# Patient Record
Sex: Female | Born: 1995
Health system: Southern US, Community
[De-identification: ages and names within clinical notes are randomized; demographics above are authoritative.]

## PROBLEM LIST (undated history)

## (undated) DIAGNOSIS — J189 Pneumonia, unspecified organism: Secondary | ICD-10-CM

## (undated) DIAGNOSIS — R0600 Dyspnea, unspecified: Secondary | ICD-10-CM

## (undated) HISTORY — PX: JOINT REPLACEMENT: SHX530

## (undated) HISTORY — PX: LUNG LOBECTOMY: SHX167

---
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
## 2017-05-11 DIAGNOSIS — R0602 Shortness of breath: Secondary | ICD-10-CM | POA: Diagnosis not present

## 2017-05-11 DIAGNOSIS — R9389 Abnormal findings on diagnostic imaging of other specified body structures: Secondary | ICD-10-CM | POA: Diagnosis not present

## 2017-05-11 DIAGNOSIS — J189 Pneumonia, unspecified organism: Secondary | ICD-10-CM | POA: Diagnosis not present

## 2017-05-11 DIAGNOSIS — R079 Chest pain, unspecified: Secondary | ICD-10-CM | POA: Diagnosis not present

## 2017-05-11 DIAGNOSIS — R918 Other nonspecific abnormal finding of lung field: Secondary | ICD-10-CM | POA: Diagnosis not present

## 2017-05-11 DIAGNOSIS — R0789 Other chest pain: Secondary | ICD-10-CM | POA: Diagnosis not present

## 2017-05-17 DIAGNOSIS — J189 Pneumonia, unspecified organism: Secondary | ICD-10-CM | POA: Diagnosis not present

## 2017-05-17 DIAGNOSIS — R9389 Abnormal findings on diagnostic imaging of other specified body structures: Secondary | ICD-10-CM | POA: Diagnosis not present

## 2017-07-01 DIAGNOSIS — J069 Acute upper respiratory infection, unspecified: Secondary | ICD-10-CM | POA: Diagnosis not present

## 2017-07-01 DIAGNOSIS — Z3009 Encounter for other general counseling and advice on contraception: Secondary | ICD-10-CM | POA: Diagnosis not present

## 2017-07-01 DIAGNOSIS — J029 Acute pharyngitis, unspecified: Secondary | ICD-10-CM | POA: Diagnosis not present

## 2017-07-01 DIAGNOSIS — J302 Other seasonal allergic rhinitis: Secondary | ICD-10-CM | POA: Diagnosis not present

## 2017-07-08 DIAGNOSIS — J029 Acute pharyngitis, unspecified: Secondary | ICD-10-CM | POA: Diagnosis not present

## 2017-07-08 DIAGNOSIS — J069 Acute upper respiratory infection, unspecified: Secondary | ICD-10-CM | POA: Diagnosis not present

## 2017-12-23 DIAGNOSIS — Z1389 Encounter for screening for other disorder: Secondary | ICD-10-CM | POA: Diagnosis not present

## 2017-12-23 DIAGNOSIS — Z68.41 Body mass index (BMI) pediatric, less than 5th percentile for age: Secondary | ICD-10-CM | POA: Diagnosis not present

## 2017-12-23 DIAGNOSIS — Z309 Encounter for contraceptive management, unspecified: Secondary | ICD-10-CM | POA: Diagnosis not present

## 2017-12-27 DIAGNOSIS — Z309 Encounter for contraceptive management, unspecified: Secondary | ICD-10-CM | POA: Diagnosis not present

## 2017-12-28 DIAGNOSIS — Z113 Encounter for screening for infections with a predominantly sexual mode of transmission: Secondary | ICD-10-CM | POA: Diagnosis not present

## 2018-01-15 DIAGNOSIS — Z113 Encounter for screening for infections with a predominantly sexual mode of transmission: Secondary | ICD-10-CM | POA: Diagnosis not present

## 2018-02-17 DIAGNOSIS — Z113 Encounter for screening for infections with a predominantly sexual mode of transmission: Secondary | ICD-10-CM | POA: Diagnosis not present

## 2018-04-29 DIAGNOSIS — J0300 Acute streptococcal tonsillitis, unspecified: Secondary | ICD-10-CM | POA: Diagnosis not present

## 2018-05-10 DIAGNOSIS — Z308 Encounter for other contraceptive management: Secondary | ICD-10-CM | POA: Diagnosis not present

## 2018-05-10 DIAGNOSIS — Z68.41 Body mass index (BMI) pediatric, less than 5th percentile for age: Secondary | ICD-10-CM | POA: Diagnosis not present

## 2018-05-10 DIAGNOSIS — Z1389 Encounter for screening for other disorder: Secondary | ICD-10-CM | POA: Diagnosis not present

## 2018-05-16 DIAGNOSIS — J00 Acute nasopharyngitis [common cold]: Secondary | ICD-10-CM | POA: Diagnosis not present

## 2018-05-19 ENCOUNTER — Encounter: Payer: Self-pay | Admitting: Women's Health

## 2018-06-01 ENCOUNTER — Ambulatory Visit: Payer: Self-pay | Admitting: *Deleted

## 2018-06-01 DIAGNOSIS — J020 Streptococcal pharyngitis: Secondary | ICD-10-CM | POA: Diagnosis not present

## 2018-06-01 NOTE — Telephone Encounter (Signed)
Pt called with having a severe throat and low grade fever of 100. She is hoarse and can see white patches in the back of her throat. She is not able to eat or drink. She does not have a provider in the area. Recommended that she goes to an urgent care. Recommend that she make an appointment with her provider if she does not start feeling better after going to an UC. Home care advice given with verbal understanding. Advised to gargle with warm salt water, sucking on hard candy or throat lozenges, eat cold things like popsicles.  Pt voiced understanding.  Reason for Disposition . [1] Pus on tonsils (back of throat) AND [2]  fever AND [3] swollen neck lymph nodes ("glands")  Answer Assessment - Initial Assessment Questions 1. ONSET: "When did the throat start hurting?" (Hours or days ago)      Sunday 2. SEVERITY: "How bad is the sore throat?" (Scale 1-10; mild, moderate or severe)   - MILD (1-3):  doesn\'t interfere with eating or normal activities   - MODERATE (4-7): interferes with eating some solids and normal activities   - SEVERE (8-10):  excruciating pain, interferes with most normal activities   - SEVERE DYSPHAGIA: can\'t swallow liquids, drooling     Severe, can not swallow  3. STREP EXPOSURE: "Has there been any exposure to strep within the past week?" If so, ask: "What type of contact occurred?"      No, had strep throat in February 4.  VIRAL SYMPTOMS: "Are there any symptoms of a cold, such as a runny nose, cough, hoarse voice or red eyes?"      hoarse 5. FEVER: "Do you have a fever?" If so, ask: "What is your temperature, how was it measured, and when did it start?"     10 0 today 6. PUS ON THE TONSILS: "Is there pus on the tonsils in the back of your throat?"     White patches 7. OTHER SYMPTOMS: "Do you have any other symptoms?" (e.g., difficulty breathing, headache, rash)     Short of breath when walking up the stairs, migraine (does not have hx) 8. PREGNANCY: "Is there any chance  you are pregnant?" "When was your last menstrual period?"     No LMP using Depop[facility]ary for Birth Control  Protocols used: SORE THROAT-A-AH

## 2018-06-02 ENCOUNTER — Ambulatory Visit
Admission: EM | Admit: 2018-06-02 | Discharge: 2018-06-02 | Disposition: A | Discharge status: Home / Self Care | Payer: BLUE CROSS/BLUE SHIELD | Attending: Family Medicine | Admitting: Family Medicine

## 2018-06-02 ENCOUNTER — Encounter: Payer: Self-pay | Admitting: Emergency Medicine

## 2018-06-02 DIAGNOSIS — N898 Other specified noninflammatory disorders of vagina: Secondary | ICD-10-CM

## 2018-06-02 DIAGNOSIS — Z3202 Encounter for pregnancy test, result negative: Secondary | ICD-10-CM | POA: Diagnosis not present

## 2018-06-02 LAB — POCT URINALYSIS DIP (MANUAL ENTRY)
BILIRUBIN UA: NEGATIVE
BILIRUBIN UA: NEGATIVE mg/dL
GLUCOSE UA: NEGATIVE mg/dL
Nitrite, UA: NEGATIVE
Urobilinogen, UA: 4 E.U./dL — AB
pH, UA: 7 (ref 5.0–8.0)

## 2018-06-02 LAB — POCT URINE PREGNANCY: Preg Test, Ur: NEGATIVE

## 2018-06-02 MED ORDER — FLUCONAZOLE 200 MG PO TABS: ORAL_TABLET | ORAL | 0 refills | Status: DC

## 2018-06-02 NOTE — ED Triage Notes (Signed)
Pt presents to [facility] for assessment of vaginal pain and burning with urination since Sunday.  Did not improve with ibuprofen.

## 2018-06-02 NOTE — Discharge Instructions (Signed)
Vaginal self-swab obtained Prescribed diflucan 200 mg once daily and then second dose after you complete amoxicillin.   We will follow up with you regarding the results of your test If tests are positive, please abstain from sexual activity for at least 7 days and notify partners Follow up with PCP if symptoms persists Return here or go to ER if you have any new or worsening symptoms fever, chills, nausea, vomiting, abdominal or pelvic pain, painful intercourse, vaginal discharge, vaginal bleeding, persistent symptoms despite treatment, etc..Marland Kitchen

## 2018-06-02 NOTE — ED Provider Notes (Addendum)
[facility]   [id number] 06/02/18 Arrival Time: 1939   [id number] irritation  SUBJECTIVE:  Pam Clark is a 23 y.o. female who presents with complaint of gradual vaginal irritation that began 3 days ago.  Recently treated for strep throat with amoxicillin and steroid shot.  Last sex last week.   Complains of "normal" vaginal discharge, but denies thick or white discharge.  She has tried OTC medications ibuprofen without relief.  She reports worsening symptoms with urination, but denies dysuria. Denies similar symptoms in the past.  She denies fever, chills, nausea, vomiting, abdominal or pelvic pain, urinary symptoms, vaginal itching, vaginal odor, vaginal bleeding, vaginal rashes or lesions.   No LMP recorded. Patient has had an injection.  ROS: As per HPI.  History reviewed. No pertinent past medical history. History reviewed. No pertinent surgical history. No Known Allergies No current facility-administered medications on file prior to encounter.    No current outpatient medications on file prior to encounter.    Social History   Socioeconomic History  . Marital status: Single    Spouse name: Not on file  . Number of children: Not on file  . Years of education: Not on file  . Highest education level: Not on file  Occupational History  . Not on file  Social Needs  . Financial resource strain: Not on file  . Food insecurity:    Worry: Not on file    Inability: Not on file  . Transportation needs:    Medical: Not on file    Non-medical: Not on file  Tobacco Use  . Smoking status: Never Smoker  . Smokeless tobacco: Never Used  Substance and Sexual Activity  . Alcohol use: No  . Drug use: No  . Sexual activity: Yes    Birth control/protection: Pill  Lifestyle  . Physical activity:    Days per week: Not on file    Minutes per session: Not on file  . Stress: Not on file  Relationships  . Social connections:    Talks on phone: Not on file    Gets  together: Not on file    Attends religious service: Not on file    Active member of club or organization: Not on file    Attends meetings of clubs or organizations: Not on file    Relationship status: Not on file  . Intimate partner violence:    Fear of current or ex partner: Not on file    Emotionally abused: Not on file    Physically abused: Not on file    Forced sexual activity: Not on file  Other Topics Concern  . Not on file  Social History Narrative  . Not on file   Family History  Problem Relation Age of Onset  . Stroke Other   . Diabetes Other   . Hypertension Other   . Seizures Other     OBJECTIVE:  Vitals:   06/02/18 1955  BP: 109/71  Pulse: 93  Resp: 18  Temp: 97.9 F (36.6 C)  TempSrc: Oral  SpO2: 95%     General appearance: Alert, NAD, appears stated age Head: NCAT Throat: lips, mucosa, and tongue normal; teeth and gums normal Lungs: CTA bilaterally without adventitious breath sounds Heart: regular rate and rhythm.  Radial pulses 2+ symmetrical bilaterally Back: no CVA tenderness Abdomen: soft, non-tender; bowel sounds normal; no guarding GU: deferred; vaginal swab obtained Skin: warm and dry Psychological:  Alert and cooperative. Normal mood and affect.  LABS:  Results  for orders placed or performed during the hospital encounter of 06/02/18  POCT urinalysis dipstick  Result Value Ref Range   Color, UA yellow yellow   Clarity, UA clear clear   Glucose, UA negative negative mg/dL   Bilirubin, UA negative negative   Ketones, POC UA negative negative mg/dL   Spec Grav, UA >=7.425 (A) 1.010 - 1.025   Blood, UA trace-intact (A) negative   pH, UA 7.0 5.0 - 8.0   Protein Ur, POC =100 (A) negative mg/dL   Urobilinogen, UA 4.0 (A) 0.2 or 1.0 E.U./dL   Nitrite, UA Negative Negative   Leukocytes, UA Trace (A) Negative  POCT urine pregnancy  Result Value Ref Range   Preg Test, Ur Negative Negative    Labs Reviewed  POCT URINALYSIS DIP (MANUAL  ENTRY) - Abnormal; Notable for the following components:      Result Value   Spec Grav, UA >=1.030 (*)    Blood, UA trace-intact (*)    Protein Ur, POC =100 (*)    Urobilinogen, UA 4.0 (*)    Leukocytes, UA Trace (*)    All other components within normal limits  POCT URINE PREGNANCY - Normal  URINE CULTURE  CERVICOVAGINAL ANCILLARY ONLY   ASSESSMENT & PLAN:  1. Vaginal irritation     Meds ordered this encounter  Medications  . fluconazole (DIFLUCAN) 200 MG tablet    Sig: Take one dose by mouth, wait until you finish your antibiotic, and then take second dose by mouth    Dispense:  2 tablet    Refill:  0    Order Specific Question:   Supervising Provider    Answer:   Eustace Moore [[id number]]    Pending: Labs Reviewed  POCT URINALYSIS DIP (MANUAL ENTRY) - Abnormal; Notable for the following components:      Result Value   Spec Grav, UA >=1.030 (*)    Blood, UA trace-intact (*)    Protein Ur, POC =100 (*)    Urobilinogen, UA 4.0 (*)    Leukocytes, UA Trace (*)    All other components within normal limits  POCT URINE PREGNANCY - Normal  URINE CULTURE  CERVICOVAGINAL ANCILLARY ONLY    Vaginal self-swab obtained Prescribed diflucan 200 mg once daily and then second dose after you complete amoxicillin.   We will follow up with you regarding the results of your test If tests are positive, please abstain from sexual activity for at least 7 days and notify partners Follow up with PCP if symptoms persists Return here or go to ER if you have any new or worsening symptoms fever, chills, nausea, vomiting, abdominal or pelvic pain, painful intercourse, vaginal discharge, vaginal bleeding, persistent symptoms despite treatment, etc...  Reviewed expectations re: course of current medical issues. Questions answered. Outlined signs and symptoms indicating need for more acute intervention. Patient verbalized understanding. After Visit Summary given.       Rennis Harding,  PA-C 06/02/18 2048    Rennis Harding, PA-C 06/02/18 2049

## 2018-06-02 NOTE — ED Notes (Signed)
Patient able to ambulate independently  

## 2018-06-03 DIAGNOSIS — B009 Herpesviral infection, unspecified: Secondary | ICD-10-CM | POA: Diagnosis not present

## 2018-06-04 LAB — URINE CULTURE: Culture: NO GROWTH

## 2018-06-06 LAB — CERVICOVAGINAL ANCILLARY ONLY
Bacterial vaginitis: POSITIVE — AB
CANDIDA VAGINITIS: NEGATIVE
CHLAMYDIA, DNA PROBE: NEGATIVE
NEISSERIA GONORRHEA: NEGATIVE
TRICH (WINDOWPATH): NEGATIVE

## 2018-06-07 ENCOUNTER — Telehealth (HOSPITAL_COMMUNITY): Payer: Self-pay | Admitting: Emergency Medicine

## 2018-06-07 MED ORDER — METRONIDAZOLE 500 MG PO TABS: 500.0000 mg | ORAL_TABLET | Freq: Two times a day (BID) | ORAL | 0 refills | Status: AC

## 2018-06-07 NOTE — Telephone Encounter (Signed)
Bacterial vaginosis is positive. This was not treated at the urgent care visit.  Flagyl 500 mg BID x 7 days #14 no refills sent to patients pharmacy of choice.    Patient contacted and made aware of all results, all questions answered.  

## 2018-06-27 DIAGNOSIS — J309 Allergic rhinitis, unspecified: Secondary | ICD-10-CM | POA: Diagnosis not present

## 2018-06-27 DIAGNOSIS — Z1389 Encounter for screening for other disorder: Secondary | ICD-10-CM | POA: Diagnosis not present

## 2018-06-27 DIAGNOSIS — Z68.41 Body mass index (BMI) pediatric, less than 5th percentile for age: Secondary | ICD-10-CM | POA: Diagnosis not present

## 2018-06-27 DIAGNOSIS — J302 Other seasonal allergic rhinitis: Secondary | ICD-10-CM | POA: Diagnosis not present

## 2018-08-15 ENCOUNTER — Encounter: Payer: Self-pay | Admitting: *Deleted

## 2018-08-16 ENCOUNTER — Encounter: Payer: Self-pay | Admitting: Women's Health

## 2018-08-16 ENCOUNTER — Other Ambulatory Visit: Payer: Self-pay

## 2018-08-16 ENCOUNTER — Ambulatory Visit (INDEPENDENT_AMBULATORY_CARE_PROVIDER_SITE_OTHER): Payer: BLUE CROSS/BLUE SHIELD | Admitting: Women's Health

## 2018-08-16 VITALS — Ht 66.0 in | Wt 140.0 lb

## 2018-08-16 DIAGNOSIS — Z3009 Encounter for other general counseling and advice on contraception: Secondary | ICD-10-CM

## 2018-08-16 NOTE — Progress Notes (Signed)
   TELEHEALTH VIRTUAL GYN VISIT ENCOUNTER NOTE Patient name: Pam Clark MRN [id number]  Date of birth: Dec 06, 1995  I connected with patient on 08/16/18 at  8:30 AM EDT by [facility] and verified that I am speaking with the correct person using two identifiers.  Due to COVID-19 recommendations, pt is not currently in the office.    I discussed the limitations, risks, security and privacy concerns of performing an evaluation and management service by telephone and the availability of in person appointments. I also discussed with the patient that there may be a patient responsible charge related to this service. The patient expressed understanding and agreed to proceed.   Chief Complaint:   discuss birth control  History of Present Illness:   Pam Clark is a 23 y.o.  African American female being evaluated today to discuss birth control. Wants Nexplanon. Last sex 5/14.   Patient's last menstrual period was 08/02/2018. The current method of family planning is condoms  Last pap <21yo. Results were:  n/a Review of Systems:   Pertinent items are noted in HPI Denies fever/chills, dizziness, headaches, visual disturbances, fatigue, shortness of breath, chest pain, abdominal pain, vomiting, abnormal vaginal discharge/itching/odor/irritation, problems with periods, bowel movements, urination, or intercourse unless otherwise stated above.  Pertinent History Reviewed:  Reviewed past medical,surgical, social, obstetrical and family history.  Reviewed problem list, medications and allergies. Physical Assessment:   Vitals:   08/16/18 0924  Weight: 140 lb (63.5 kg)  Height: 5\' 6"  (1.676 m)  Body mass index is 22.6 kg/m.       Physical Examination:   General:  Alert, oriented and cooperative.   Mental Status: Normal mood and affect perceived. Normal judgment and thought content.  Physical exam deferred due to nature of the encounter  No results found for this or any previous visit (from the past 24  hour(s)).  Assessment & Plan:  1) Contraception counseling> abstinence until nexplanon insertion, schedule for 5/28 (14d from last sex)  Meds: No orders of the defined types were placed in this encounter.   No orders of the defined types were placed in this encounter.   I discussed the assessment and treatment plan with the patient. The patient was provided an opportunity to ask questions and all were answered. The patient agreed with the plan and demonstrated an understanding of the instructions.   The patient was advised to call back or seek an in-person evaluation/go to the ED if the symptoms worsen or if the condition fails to improve as anticipated.  I provided 15 minutes of non-face-to-face time during this encounter.   Return in about 9 days (around 08/25/2018) for Nexplanon insertion.  Cheral Marker CNM, [facility] 08/16/2018 9:49 AM

## 2018-08-24 ENCOUNTER — Encounter: Payer: Self-pay | Admitting: *Deleted

## 2018-08-25 ENCOUNTER — Encounter: Payer: Self-pay | Admitting: Women's Health

## 2018-08-25 ENCOUNTER — Other Ambulatory Visit: Payer: Self-pay

## 2018-08-25 ENCOUNTER — Ambulatory Visit (INDEPENDENT_AMBULATORY_CARE_PROVIDER_SITE_OTHER): Payer: BLUE CROSS/BLUE SHIELD | Admitting: Women's Health

## 2018-08-25 VITALS — BP 89/57 | HR 69 | Temp 98.4°F | Ht 66.0 in | Wt 142.0 lb

## 2018-08-25 DIAGNOSIS — Z30017 Encounter for initial prescription of implantable subdermal contraceptive: Secondary | ICD-10-CM | POA: Insufficient documentation

## 2018-08-25 DIAGNOSIS — Z3202 Encounter for pregnancy test, result negative: Secondary | ICD-10-CM | POA: Diagnosis not present

## 2018-08-25 LAB — POCT URINE PREGNANCY: Preg Test, Ur: NEGATIVE

## 2018-08-25 MED ORDER — ETONOGESTREL 68 MG ~~LOC~~ IMPL
68.0000 mg | DRUG_IMPLANT | Freq: Once | SUBCUTANEOUS | Status: AC
  Administered 2018-08-25: 68 mg via SUBCUTANEOUS

## 2018-08-25 NOTE — Patient Instructions (Signed)
Keep the area clean and dry.  You can remove the big bandage in 24 hours, and the small steri-strip bandage in 3-5 days.  A back up method, such as condoms, should be used for two weeks. You may have irregular vaginal bleeding for the first 6 months after the Nexplanon is placed, then the bleeding usually lightens and it is possible that you may not have any periods.  If you have any concerns, please give us a call.    Etonogestrel implant What is this medicine? ETONOGESTREL (et oh noe JES trel) is a contraceptive (birth control) device. It is used to prevent pregnancy. It can be used for up to 3 years. This medicine may be used for other purposes; ask your health care provider or pharmacist if you have questions. COMMON BRAND NAME(S): Implanon, Nexplanon What should I tell my health care provider before I take this medicine? They need to know if you have any of these conditions: -abnormal vaginal bleeding -blood vessel disease or blood clots -breast, cervical, endometrial, ovarian, liver, or uterine cancer -diabetes -gallbladder disease -heart disease or recent heart attack -high blood pressure -high cholesterol or triglycerides -kidney disease -liver disease -migraine headaches -seizures -stroke -tobacco smoker -an unusual or allergic reaction to etonogestrel, anesthetics or antiseptics, other medicines, foods, dyes, or preservatives -pregnant or trying to get pregnant -breast-feeding How should I use this medicine? This device is inserted just under the skin on the inner side of your upper arm by a health care professional. Talk to your pediatrician regarding the use of this medicine in children. Special care may be needed. Overdosage: If you think you have taken too much of this medicine contact a poison control center or emergency room at once. NOTE: This medicine is only for you. Do not share this medicine with others. What if I miss a dose? This does not apply. What may  interact with this medicine? Do not take this medicine with any of the following medications: -amprenavir -fosamprenavir This medicine may also interact with the following medications: -acitretin -aprepitant -armodafinil -bexarotene -bosentan -carbamazepine -certain medicines for fungal infections like fluconazole, ketoconazole, itraconazole and voriconazole -certain medicines to treat hepatitis, HIV or AIDS -cyclosporine -felbamate -griseofulvin -lamotrigine -modafinil -oxcarbazepine -phenobarbital -phenytoin -primidone -rifabutin -rifampin -rifapentine -St. John's wort -topiramate This list may not describe all possible interactions. Give your health care provider a list of all the medicines, herbs, non-prescription drugs, or dietary supplements you use. Also tell them if you smoke, drink alcohol, or use illegal drugs. Some items may interact with your medicine. What should I watch for while using this medicine? This product does not protect you against HIV infection (AIDS) or other sexually transmitted diseases. You should be able to feel the implant by pressing your fingertips over the skin where it was inserted. Contact your doctor if you cannot feel the implant, and use a non-hormonal birth control method (such as condoms) until your doctor confirms that the implant is in place. Contact your doctor if you think that the implant may have broken or become bent while in your arm. You will receive a user card from your health care provider after the implant is inserted. The card is a record of the location of the implant in your upper arm and when it should be removed. Keep this card with your health records. What side effects may I notice from receiving this medicine? Side effects that you should report to your doctor or health care professional as soon as possible: -allergic   reactions like skin rash, itching or hives, swelling of the face, lips, or tongue -breast lumps, breast  tissue changes, or discharge -breathing problems -changes in emotions or moods -if you feel that the implant may have broken or bent while in your arm -high blood pressure -pain, irritation, swelling, or bruising at the insertion site -scar at site of insertion -signs of infection at the insertion site such as fever, and skin redness, pain or discharge -signs and symptoms of a blood clot such as breathing problems; changes in vision; chest pain; severe, sudden headache; pain, swelling, warmth in the leg; trouble speaking; sudden numbness or weakness of the face, arm or leg -signs and symptoms of liver injury like dark yellow or brown urine; general ill feeling or flu-like symptoms; light-colored stools; loss of appetite; nausea; right upper belly pain; unusually weak or tired; yellowing of the eyes or skin -unusual vaginal bleeding, discharge Side effects that usually do not require medical attention (report to your doctor or health care professional if they continue or are bothersome): -acne -breast pain or tenderness -headache -irregular menstrual bleeding -nausea This list may not describe all possible side effects. Call your doctor for medical advice about side effects. You may report side effects to FDA at 1-800-FDA-1088. Where should I keep my medicine? This drug is given in a hospital or clinic and will not be stored at home. NOTE: This sheet is a summary. It may not cover all possible information. If you have questions about this medicine, talk to your doctor, pharmacist, or health care provider.  2019 Elsevier/Gold Standard (2017-02-02 14:11:42)  

## 2018-08-25 NOTE — Progress Notes (Signed)
   NEXPLANON INSERTION Patient name: Pam Clark MRN [id number]  Date of birth: 1996/02/19 Subjective Findings:   Pam Clark is a 23 y.o. G0 African American female being seen today for insertion of a Nexplanon.   Patient's last menstrual period was 08/25/2018. Last sexual intercourse was 5/14 Last pap<23yo. Results were:  n/a  Risks/benefits/side effects of Nexplanon have been discussed and her questions have been answered.  Specifically, a failure rate of 03/998 has been reported, with an increased failure rate if pt takes St. John's Wort and/or antiseizure medicaitons.  She is aware of the common side effect of irregular bleeding, which the incidence of decreases over time. Signed copy of informed consent in chart.  Pertinent History Reviewed:   Reviewed past medical,surgical, social, obstetrical and family history.  Reviewed problem list, medications and allergies. Objective Findings & Procedure:    Vitals:   08/25/18 0838  BP: (!) 89/57  Pulse: 69  Temp: 98.4 F (36.9 C)  Weight: 142 lb (64.4 kg)  Height: 5\' 6"  (1.676 m)  Body mass index is 22.92 kg/m.  Results for orders placed or performed in visit on 08/25/18 (from the past 24 hour(s))  POCT urine pregnancy   Collection Time: 08/25/18  8:46 AM  Result Value Ref Range   Preg Test, Ur Negative Negative     Time out was performed.  She is right-handed, so her left arm, approximately 10cm from the medial epicondyle and 3-5cm posterior to the sulcus, was cleansed with alcohol and anesthetized with 2cc of 2% Lidocaine.  The area was cleansed again with betadine and the Nexplanon was inserted per manufacturer's recommendations without difficulty.  3 steri-strips and pressure bandage were applied. The patient tolerated the procedure well.  Assessment & Plan:   1) Nexplanon insertion Pt was instructed to keep the area clean and dry, remove pressure bandage in 24 hours, and keep insertion site covered with the steri-strip for  3-5 days.  Back up contraception was recommended for 2 weeks.  She was given a card indicating date Nexplanon was inserted and date it needs to be removed. Follow-up PRN problems.  Orders Placed This Encounter  Procedures  . POCT urine pregnancy    Follow-up: Return for 23yo for pap & physical.  Cheral Marker CNM, [facility] 08/25/2018 9:17 AM

## 2018-08-25 NOTE — Addendum Note (Signed)
Addended by: Federico Flake A on: 08/25/2018 09:41 AM   Modules accepted: Orders

## 2018-10-25 DIAGNOSIS — Z113 Encounter for screening for infections with a predominantly sexual mode of transmission: Secondary | ICD-10-CM | POA: Diagnosis not present

## 2018-11-02 DIAGNOSIS — L258 Unspecified contact dermatitis due to other agents: Secondary | ICD-10-CM | POA: Diagnosis not present

## 2018-11-02 DIAGNOSIS — L878 Other transepidermal elimination disorders: Secondary | ICD-10-CM | POA: Diagnosis not present

## 2019-02-01 DIAGNOSIS — N760 Acute vaginitis: Secondary | ICD-10-CM | POA: Diagnosis not present

## 2019-02-01 DIAGNOSIS — Z113 Encounter for screening for infections with a predominantly sexual mode of transmission: Secondary | ICD-10-CM | POA: Diagnosis not present

## 2019-02-02 DIAGNOSIS — Z113 Encounter for screening for infections with a predominantly sexual mode of transmission: Secondary | ICD-10-CM | POA: Diagnosis not present

## 2019-02-02 DIAGNOSIS — N760 Acute vaginitis: Secondary | ICD-10-CM | POA: Diagnosis not present

## 2019-02-02 DIAGNOSIS — Z20828 Contact with and (suspected) exposure to other viral communicable diseases: Secondary | ICD-10-CM | POA: Diagnosis not present

## 2019-04-16 ENCOUNTER — Emergency Department (HOSPITAL_COMMUNITY): Payer: BC Managed Care – PPO

## 2019-04-16 ENCOUNTER — Encounter (HOSPITAL_COMMUNITY): Payer: Self-pay | Admitting: Radiology

## 2019-04-16 ENCOUNTER — Emergency Department (HOSPITAL_COMMUNITY)
Admission: EM | Admit: 2019-04-16 | Discharge: 2019-04-16 | Disposition: A | Discharge status: Home / Self Care | Payer: BC Managed Care – PPO | Attending: Emergency Medicine | Admitting: Emergency Medicine

## 2019-04-16 DIAGNOSIS — Y92410 Unspecified street and highway as the place of occurrence of the external cause: Secondary | ICD-10-CM | POA: Insufficient documentation

## 2019-04-16 DIAGNOSIS — Y999 Unspecified external cause status: Secondary | ICD-10-CM | POA: Diagnosis not present

## 2019-04-16 DIAGNOSIS — S73005A Unspecified dislocation of left hip, initial encounter: Secondary | ICD-10-CM | POA: Diagnosis not present

## 2019-04-16 DIAGNOSIS — Z03818 Encounter for observation for suspected exposure to other biological agents ruled out: Secondary | ICD-10-CM | POA: Diagnosis not present

## 2019-04-16 DIAGNOSIS — Y9389 Activity, other specified: Secondary | ICD-10-CM | POA: Insufficient documentation

## 2019-04-16 DIAGNOSIS — S27321A Contusion of lung, unilateral, initial encounter: Secondary | ICD-10-CM | POA: Insufficient documentation

## 2019-04-16 DIAGNOSIS — S199XXA Unspecified injury of neck, initial encounter: Secondary | ICD-10-CM | POA: Diagnosis not present

## 2019-04-16 DIAGNOSIS — M25552 Pain in left hip: Secondary | ICD-10-CM | POA: Diagnosis not present

## 2019-04-16 DIAGNOSIS — S3991XA Unspecified injury of abdomen, initial encounter: Secondary | ICD-10-CM | POA: Diagnosis not present

## 2019-04-16 DIAGNOSIS — S72052A Unspecified fracture of head of left femur, initial encounter for closed fracture: Secondary | ICD-10-CM | POA: Diagnosis not present

## 2019-04-16 DIAGNOSIS — Z20822 Contact with and (suspected) exposure to covid-19: Secondary | ICD-10-CM | POA: Insufficient documentation

## 2019-04-16 DIAGNOSIS — S8992XA Unspecified injury of left lower leg, initial encounter: Secondary | ICD-10-CM | POA: Diagnosis not present

## 2019-04-16 DIAGNOSIS — R52 Pain, unspecified: Secondary | ICD-10-CM | POA: Diagnosis not present

## 2019-04-16 DIAGNOSIS — S79912A Unspecified injury of left hip, initial encounter: Secondary | ICD-10-CM | POA: Diagnosis not present

## 2019-04-16 DIAGNOSIS — S0990XA Unspecified injury of head, initial encounter: Secondary | ICD-10-CM | POA: Diagnosis not present

## 2019-04-16 DIAGNOSIS — S299XXA Unspecified injury of thorax, initial encounter: Secondary | ICD-10-CM | POA: Diagnosis not present

## 2019-04-16 DIAGNOSIS — M25562 Pain in left knee: Secondary | ICD-10-CM | POA: Diagnosis not present

## 2019-04-16 LAB — URINALYSIS, ROUTINE W REFLEX MICROSCOPIC
Bacteria, UA: NONE SEEN
Bilirubin Urine: NEGATIVE
Glucose, UA: NEGATIVE mg/dL
Hgb urine dipstick: NEGATIVE
Ketones, ur: 5 mg/dL — AB
Nitrite: NEGATIVE
Protein, ur: 30 mg/dL — AB
Specific Gravity, Urine: 1.032 — ABNORMAL HIGH (ref 1.005–1.030)
pH: 6 (ref 5.0–8.0)

## 2019-04-16 LAB — I-STAT BETA HCG BLOOD, ED (MC, WL, AP ONLY): I-stat hCG, quantitative: <5 m[IU]/mL (ref <5)

## 2019-04-16 LAB — COMPREHENSIVE METABOLIC PANEL
ALT: 36 U/L (ref 0–44)
AST: 54 U/L — ABNORMAL HIGH (ref 15–41)
Albumin: 4.5 g/dL (ref 3.5–5.0)
Alkaline Phosphatase: 59 U/L (ref 38–126)
Anion gap: 10 (ref 5–15)
BUN: 17 mg/dL (ref 6–20)
CO2: 25 mmol/L (ref 22–32)
Calcium: 9.4 mg/dL (ref 8.9–10.3)
Chloride: 104 mmol/L (ref 98–111)
Creatinine, Ser: 0.82 mg/dL (ref 0.44–1.00)
GFR calc Af Amer: >60 mL/min (ref >60)
GFR calc non Af Amer: >60 mL/min (ref >60)
Glucose, Bld: 142 mg/dL — ABNORMAL HIGH (ref 70–99)
Potassium: 3.1 mmol/L — ABNORMAL LOW (ref 3.5–5.1)
Sodium: 139 mmol/L (ref 135–145)
Total Bilirubin: 0.7 mg/dL (ref 0.3–1.2)
Total Protein: 7.5 g/dL (ref 6.5–8.1)

## 2019-04-16 LAB — CBC
HCT: 36.4 % (ref 36.0–46.0)
Hemoglobin: 11.9 g/dL — ABNORMAL LOW (ref 12.0–15.0)
MCH: 31.2 pg (ref 26.0–34.0)
MCHC: 32.7 g/dL (ref 30.0–36.0)
MCV: 95.3 fL (ref 80.0–100.0)
Platelets: 216 10*3/uL (ref 150–400)
RBC: 3.82 MIL/uL — ABNORMAL LOW (ref 3.87–5.11)
RDW: 12.2 % (ref 11.5–15.5)
WBC: 7.2 10*3/uL (ref 4.0–10.5)
nRBC: 0 % (ref 0.0–0.2)

## 2019-04-16 LAB — I-STAT CHEM 8, ED
BUN: 18 mg/dL (ref 6–20)
Calcium, Ion: 1.13 mmol/L — ABNORMAL LOW (ref 1.15–1.40)
Chloride: 103 mmol/L (ref 98–111)
Creatinine, Ser: 0.7 mg/dL (ref 0.44–1.00)
Glucose, Bld: 136 mg/dL — ABNORMAL HIGH (ref 70–99)
HCT: 38 % (ref 36.0–46.0)
Hemoglobin: 12.9 g/dL (ref 12.0–15.0)
Potassium: 3.2 mmol/L — ABNORMAL LOW (ref 3.5–5.1)
Sodium: 139 mmol/L (ref 135–145)
TCO2: 25 mmol/L (ref 22–32)

## 2019-04-16 LAB — ETHANOL: Alcohol, Ethyl (B): <10 mg/dL (ref <10)

## 2019-04-16 LAB — RESPIRATORY PANEL BY RT PCR (FLU A&B, COVID)
Influenza A by PCR: NEGATIVE
Influenza B by PCR: NEGATIVE
SARS Coronavirus 2 by RT PCR: NEGATIVE

## 2019-04-16 MED ORDER — IOHEXOL 350 MG/ML SOLN
80.0000 mL | Freq: Once | INTRAVENOUS | Status: AC | PRN
  Administered 2019-04-16: 16:00:00 80 mL via INTRAVENOUS

## 2019-04-16 MED ORDER — MORPHINE SULFATE 10 MG/ML IJ SOLN
INTRAMUSCULAR | Status: AC | PRN
  Administered 2019-04-16: 25 mg via INTRAVENOUS

## 2019-04-16 MED ORDER — ONDANSETRON HCL 4 MG/2ML IJ SOLN
4.0000 mg | Freq: Once | INTRAMUSCULAR | Status: AC
  Administered 2019-04-16: 18:00:00 4 mg via INTRAVENOUS

## 2019-04-16 MED ORDER — SODIUM CHLORIDE 0.9 % IV BOLUS
1000.0000 mL | Freq: Once | INTRAVENOUS | Status: AC
  Administered 2019-04-16: 10:00:00 1000 mL via INTRAVENOUS

## 2019-04-16 MED ORDER — TRAMADOL HCL 50 MG PO TABS: 50.0000 mg | ORAL_TABLET | Freq: Four times a day (QID) | ORAL | 0 refills | Status: DC | PRN

## 2019-04-16 MED ORDER — FENTANYL CITRATE (PF) 100 MCG/2ML IJ SOLN
INTRAMUSCULAR | Status: AC
  Filled 2019-04-16: qty 2

## 2019-04-16 MED ORDER — MORPHINE SULFATE (PF) 4 MG/ML IV SOLN
4.0000 mg | Freq: Once | INTRAVENOUS | Status: AC
  Administered 2019-04-16: 11:00:00 4 mg via INTRAVENOUS
  Filled 2019-04-16: qty 1

## 2019-04-16 MED ORDER — PROPOFOL 10 MG/ML IV BOLUS
INTRAVENOUS | Status: AC | PRN
  Administered 2019-04-16 (×2): 50 mg via INTRAVENOUS
  Administered 2019-04-16: 25 mg via INTRAVENOUS

## 2019-04-16 MED ORDER — IOHEXOL 300 MG/ML  SOLN
100.0000 mL | Freq: Once | INTRAMUSCULAR | Status: AC | PRN
  Administered 2019-04-16: 13:00:00 100 mL via INTRAVENOUS

## 2019-04-16 MED ORDER — ONDANSETRON HCL 4 MG/2ML IJ SOLN
INTRAMUSCULAR | Status: AC
  Filled 2019-04-16: qty 2

## 2019-04-16 MED ORDER — IBUPROFEN 600 MG PO TABS: 600.0000 mg | ORAL_TABLET | Freq: Four times a day (QID) | ORAL | 0 refills | Status: DC | PRN

## 2019-04-16 MED ORDER — MORPHINE SULFATE (PF) 4 MG/ML IV SOLN
4.0000 mg | Freq: Once | INTRAVENOUS | Status: AC
  Administered 2019-04-16: 10:00:00 4 mg via INTRAVENOUS
  Filled 2019-04-16: qty 1

## 2019-04-16 MED ORDER — PROPOFOL 10 MG/ML IV BOLUS
70.0000 mg | Freq: Once | INTRAVENOUS | Status: DC
  Filled 2019-04-16: qty 20

## 2019-04-16 MED ORDER — ONDANSETRON HCL 4 MG/2ML IJ SOLN
4.0000 mg | Freq: Once | INTRAMUSCULAR | Status: AC
  Administered 2019-04-16: 10:00:00 4 mg via INTRAVENOUS
  Filled 2019-04-16: qty 2
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
## 2019-04-16 NOTE — ED Notes (Signed)
X-ray at bedside
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
## 2019-04-16 NOTE — Sedation Documentation (Signed)
Ortho at bedside placing brace.  Attempted to call father, per pt, but was sent to voicemail
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
## 2019-04-16 NOTE — ED Notes (Signed)
Pt to Xray.
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
## 2019-04-16 NOTE — ED Notes (Signed)
Dr. White at bedside.
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
## 2019-04-16 NOTE — ED Notes (Signed)
TRN-- rounded on pt, she is sleeping at present--  Anell Barr, RN

## 2019-04-16 NOTE — Sedation Documentation (Signed)
L hip reduced by Dr Samuella Bruin
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
## 2019-04-16 NOTE — Progress Notes (Signed)
Orthopedic Tech Progress Note Patient Details:  Pam Clark Jul 27, 1995 [id number]  Ortho Devices Type of Ortho Device: Knee Immobilizer Ortho Device/Splint Location: left Ortho Device/Splint Interventions: Application   Post Interventions Patient Tolerated: Well Instructions Provided: Care of device   Saul Fordyce 04/16/2019, 11:56 AM

## 2019-04-16 NOTE — ED Notes (Signed)
FAST negative per Dr Lockie Mola

## 2019-04-17 ENCOUNTER — Emergency Department (HOSPITAL_COMMUNITY): Payer: BC Managed Care – PPO

## 2019-04-17 ENCOUNTER — Other Ambulatory Visit: Payer: Self-pay

## 2019-04-17 ENCOUNTER — Emergency Department (HOSPITAL_COMMUNITY)
Admission: EM | Admit: 2019-04-17 | Discharge: 2019-04-18 | Disposition: A | Discharge status: Home / Self Care | Payer: BC Managed Care – PPO | Attending: Emergency Medicine | Admitting: Emergency Medicine

## 2019-04-17 DIAGNOSIS — M791 Myalgia, unspecified site: Secondary | ICD-10-CM | POA: Diagnosis not present

## 2019-04-17 DIAGNOSIS — R0789 Other chest pain: Secondary | ICD-10-CM | POA: Insufficient documentation

## 2019-04-17 DIAGNOSIS — Z79899 Other long term (current) drug therapy: Secondary | ICD-10-CM | POA: Insufficient documentation

## 2019-04-17 DIAGNOSIS — R079 Chest pain, unspecified: Secondary | ICD-10-CM | POA: Diagnosis not present

## 2019-04-17 LAB — CBC
HCT: 34.8 % — ABNORMAL LOW (ref 36.0–46.0)
Hemoglobin: 11.4 g/dL — ABNORMAL LOW (ref 12.0–15.0)
MCH: 31.1 pg (ref 26.0–34.0)
MCHC: 32.8 g/dL (ref 30.0–36.0)
MCV: 94.8 fL (ref 80.0–100.0)
Platelets: 202 10*3/uL (ref 150–400)
RBC: 3.67 MIL/uL — ABNORMAL LOW (ref 3.87–5.11)
RDW: 12.1 % (ref 11.5–15.5)
WBC: 6.6 10*3/uL (ref 4.0–10.5)
nRBC: 0 % (ref 0.0–0.2)

## 2019-04-17 LAB — I-STAT BETA HCG BLOOD, ED (MC, WL, AP ONLY): I-stat hCG, quantitative: <5 m[IU]/mL (ref <5)

## 2019-04-17 LAB — BASIC METABOLIC PANEL
Anion gap: 10 (ref 5–15)
BUN: 13 mg/dL (ref 6–20)
CO2: 26 mmol/L (ref 22–32)
Calcium: 8.9 mg/dL (ref 8.9–10.3)
Chloride: 101 mmol/L (ref 98–111)
Creatinine, Ser: 0.73 mg/dL (ref 0.44–1.00)
GFR calc Af Amer: >60 mL/min (ref >60)
GFR calc non Af Amer: >60 mL/min (ref >60)
Glucose, Bld: 118 mg/dL — ABNORMAL HIGH (ref 70–99)
Potassium: 3.1 mmol/L — ABNORMAL LOW (ref 3.5–5.1)
Sodium: 137 mmol/L (ref 135–145)

## 2019-04-17 LAB — TROPONIN I (HIGH SENSITIVITY): Troponin I (High Sensitivity): <2 ng/L (ref <18)
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
## 2019-04-18 LAB — TROPONIN I (HIGH SENSITIVITY): Troponin I (High Sensitivity): <2 ng/L (ref <18)
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
## 2019-04-20 DIAGNOSIS — M25552 Pain in left hip: Secondary | ICD-10-CM | POA: Diagnosis not present

## 2019-04-20 DIAGNOSIS — S73025A Obturator dislocation of left hip, initial encounter: Secondary | ICD-10-CM | POA: Diagnosis not present

## 2019-04-22 DIAGNOSIS — M25552 Pain in left hip: Secondary | ICD-10-CM | POA: Diagnosis not present

## 2019-04-27 DIAGNOSIS — M25552 Pain in left hip: Secondary | ICD-10-CM | POA: Diagnosis not present

## 2019-05-01 HISTORY — PX: HIP ARTHROPLASTY: SHX981

## 2019-05-11 DIAGNOSIS — Z0000 Encounter for general adult medical examination without abnormal findings: Secondary | ICD-10-CM | POA: Diagnosis not present

## 2019-05-11 DIAGNOSIS — N390 Urinary tract infection, site not specified: Secondary | ICD-10-CM | POA: Diagnosis not present

## 2019-05-11 DIAGNOSIS — R739 Hyperglycemia, unspecified: Secondary | ICD-10-CM | POA: Diagnosis not present

## 2019-05-11 DIAGNOSIS — Z0001 Encounter for general adult medical examination with abnormal findings: Secondary | ICD-10-CM | POA: Diagnosis not present

## 2019-05-11 DIAGNOSIS — Z6821 Body mass index (BMI) 21.0-21.9, adult: Secondary | ICD-10-CM | POA: Diagnosis not present

## 2019-05-11 DIAGNOSIS — Z1389 Encounter for screening for other disorder: Secondary | ICD-10-CM | POA: Diagnosis not present

## 2019-05-17 ENCOUNTER — Encounter: Payer: Self-pay | Admitting: Pulmonary Disease

## 2019-05-19 DIAGNOSIS — S72052A Unspecified fracture of head of left femur, initial encounter for closed fracture: Secondary | ICD-10-CM | POA: Diagnosis not present

## 2019-05-19 DIAGNOSIS — M1612 Unilateral primary osteoarthritis, left hip: Secondary | ICD-10-CM | POA: Diagnosis not present

## 2019-06-01 ENCOUNTER — Institutional Professional Consult (permissible substitution): Payer: BC Managed Care – PPO | Admitting: Pulmonary Disease

## 2019-06-30 DIAGNOSIS — Z96642 Presence of left artificial hip joint: Secondary | ICD-10-CM | POA: Diagnosis not present

## 2019-06-30 DIAGNOSIS — Z471 Aftercare following joint replacement surgery: Secondary | ICD-10-CM | POA: Diagnosis not present

## 2019-07-07 DIAGNOSIS — Z6822 Body mass index (BMI) 22.0-22.9, adult: Secondary | ICD-10-CM | POA: Diagnosis not present

## 2019-07-07 DIAGNOSIS — Z96642 Presence of left artificial hip joint: Secondary | ICD-10-CM | POA: Diagnosis not present

## 2019-07-07 DIAGNOSIS — J3089 Other allergic rhinitis: Secondary | ICD-10-CM | POA: Diagnosis not present

## 2019-08-07 ENCOUNTER — Other Ambulatory Visit: Payer: Self-pay

## 2019-08-07 ENCOUNTER — Encounter: Payer: Self-pay | Admitting: Emergency Medicine

## 2019-08-07 ENCOUNTER — Ambulatory Visit
Admission: EM | Admit: 2019-08-07 | Discharge: 2019-08-07 | Disposition: A | Discharge status: Home / Self Care | Payer: BC Managed Care – PPO | Attending: Emergency Medicine | Admitting: Emergency Medicine

## 2019-08-07 DIAGNOSIS — Z113 Encounter for screening for infections with a predominantly sexual mode of transmission: Secondary | ICD-10-CM | POA: Diagnosis not present

## 2019-08-07 NOTE — Discharge Instructions (Signed)
Vaginal self swab obtained  HIV/ syphilis testing today We will follow up with you regarding the results of your test If tests are positive, please abstain from sexual activity until you and your partner(s) are treated Follow up with PCP as needed Return here or go to ER if you have any new or worsening symptoms fever, chills, nausea, vomiting, abdominal pain, vaginal discharge, vaginal odor, vaginal pain, vaginal burning, etc..Marland Kitchen

## 2019-08-07 NOTE — ED Provider Notes (Signed)
[facility]   [id number] 08/07/19 Arrival Time: [place]   XK:GYJEHUD FOR STD  SUBJECTIVE:  Pam Clark is a 24 y.o. female who presents requesting STI screening.  Currently asymptomatic.  Partner asymptomatic.  Last unprotected sexual encounter 2 weeks ago.  Sexually active with 2 female partners over the past 6 months.  Denies STDs in the past.  Denies fever, chills, nausea, vomiting, abdominal or pelvic pain, urinary symptoms, vaginal itching, vaginal odor, vaginal bleeding, dyspareunia, vaginal rashes or lesions.   No LMP recorded. (Menstrual status: Other). Has nexplanon, uses condoms intermittently  ROS: As per HPI.  All other pertinent ROS negative.     No past medical history on file. No past surgical history on file. No Known Allergies No current facility-administered medications on file prior to encounter.   Current Outpatient Medications on File Prior to Encounter  Medication Sig Dispense Refill  . etonogestrel (NEXPLANON) 68 MG IMPL implant 1 each by Subdermal route once.     Social History   Socioeconomic History  . Marital status: Single    Spouse name: Not on file  . Number of children: 0  . Years of education: Not on file  . Highest education level: Not on file  Occupational History  . Not on file  Tobacco Use  . Smoking status: Never Smoker  . Smokeless tobacco: Never Used  Substance and Sexual Activity  . Alcohol use: No  . Drug use: No  . Sexual activity: Yes    Birth control/protection: None  Other Topics Concern  . Not on file  Social History Narrative  . Not on file   Social Determinants of Health   Financial Resource Strain:   . Difficulty of Paying Living Expenses:   Food Insecurity:   . Worried About Charity fundraiser in the Last Year:   . Arboriculturist in the Last Year:   Transportation Needs:   . Film/video editor (Medical):   Marland Kitchen Lack of Transportation (Non-Medical):   Physical Activity:   . Days of Exercise per Week:    . Minutes of Exercise per Session:   Stress:   . Feeling of Stress :   Social Connections:   . Frequency of Communication with Friends and Family:   . Frequency of Social Gatherings with Friends and Family:   . Attends Religious Services:   . Active Member of Clubs or Organizations:   . Attends Archivist Meetings:   Marland Kitchen Marital Status:   Intimate Partner Violence:   . Fear of Current or Ex-Partner:   . Emotionally Abused:   Marland Kitchen Physically Abused:   . Sexually Abused:    Family History  Problem Relation Age of Onset  . Stroke Other   . Diabetes Other   . Hypertension Other   . Seizures Other     OBJECTIVE:  Vitals:   08/07/19 1803  BP: 120/76  Pulse: 72  Resp: 16  Temp: 98.7 F (37.1 C)  TempSrc: Oral  SpO2: 98%     General appearance: alert, NAD, appears stated age Head: NCAT ENT: PERRL, EOMi grossly; oropharynx clear Throat: lips, mucosa, and tongue normal; teeth and gums normal Lungs: CTA bilaterally without adventitious breath sounds Heart: regular rate and rhythm.   Abdomen: soft, non-tender; bowel sounds normal; no guarding  GU: deferred Skin: warm and dry Psychological:  Alert and cooperative. Normal mood and affect.  ASSESSMENT & PLAN:  1. Screening examination for venereal disease    Vaginal self  swab obtained  HIV/ syphilis testing today We will follow up with you regarding the results of your test If tests are positive, please abstain from sexual activity until you and your partner(s) are treated Follow up with PCP as needed Return here or go to ER if you have any new or worsening symptoms fever, chills, nausea, vomiting, abdominal pain, vaginal discharge, vaginal odor, vaginal pain, vaginal burning, etc...  Reviewed expectations re: course of current medical issues. Questions answered. Outlined signs and symptoms indicating need for more acute intervention. Patient verbalized understanding. After Visit Summary given.        Rennis Harding, PA-C 08/07/19 1811

## 2019-08-07 NOTE — ED Triage Notes (Signed)
requesting std screening.  Seen by provider prior to this nurse

## 2019-08-08 ENCOUNTER — Telehealth (HOSPITAL_COMMUNITY): Payer: Self-pay

## 2019-08-08 LAB — CERVICOVAGINAL ANCILLARY ONLY
Bacterial Vaginitis (gardnerella): POSITIVE — AB
Candida Glabrata: NEGATIVE
Candida Vaginitis: POSITIVE — AB
Chlamydia: NEGATIVE
Comment: NEGATIVE
Comment: NEGATIVE
Comment: NEGATIVE
Comment: NEGATIVE
Comment: NEGATIVE
Comment: NORMAL
Neisseria Gonorrhea: NEGATIVE
Trichomonas: NEGATIVE

## 2019-08-08 LAB — RPR: RPR Ser Ql: NONREACTIVE

## 2019-08-08 LAB — HIV ANTIBODY (ROUTINE TESTING W REFLEX): HIV Screen 4th Generation wRfx: NONREACTIVE

## 2019-08-08 MED ORDER — METRONIDAZOLE 500 MG PO TABS: 500.0000 mg | ORAL_TABLET | Freq: Two times a day (BID) | ORAL | 0 refills | Status: DC

## 2019-08-08 MED ORDER — FLUCONAZOLE 150 MG PO TABS: 150.0000 mg | ORAL_TABLET | Freq: Every day | ORAL | 0 refills | Status: AC

## 2019-09-25 ENCOUNTER — Encounter (HOSPITAL_COMMUNITY): Payer: Self-pay | Admitting: Emergency Medicine

## 2019-09-25 ENCOUNTER — Emergency Department (HOSPITAL_COMMUNITY): Payer: BC Managed Care – PPO

## 2019-09-25 ENCOUNTER — Emergency Department (HOSPITAL_COMMUNITY)
Admission: EM | Admit: 2019-09-25 | Discharge: 2019-09-25 | Disposition: A | Discharge status: Home / Self Care | Payer: BC Managed Care – PPO | Attending: Emergency Medicine | Admitting: Emergency Medicine

## 2019-09-25 DIAGNOSIS — R079 Chest pain, unspecified: Secondary | ICD-10-CM | POA: Diagnosis not present

## 2019-09-25 DIAGNOSIS — R0602 Shortness of breath: Secondary | ICD-10-CM | POA: Diagnosis not present

## 2019-09-25 DIAGNOSIS — R05 Cough: Secondary | ICD-10-CM | POA: Insufficient documentation

## 2019-09-25 DIAGNOSIS — R059 Cough, unspecified: Secondary | ICD-10-CM

## 2019-09-25 DIAGNOSIS — R0789 Other chest pain: Secondary | ICD-10-CM | POA: Diagnosis not present

## 2019-09-25 DIAGNOSIS — Z20822 Contact with and (suspected) exposure to covid-19: Secondary | ICD-10-CM | POA: Insufficient documentation

## 2019-09-25 LAB — BASIC METABOLIC PANEL
Anion gap: 9 (ref 5–15)
BUN: 11 mg/dL (ref 6–20)
CO2: 24 mmol/L (ref 22–32)
Calcium: 9.1 mg/dL (ref 8.9–10.3)
Chloride: 104 mmol/L (ref 98–111)
Creatinine, Ser: 0.82 mg/dL (ref 0.44–1.00)
GFR calc Af Amer: >60 mL/min (ref >60)
GFR calc non Af Amer: >60 mL/min (ref >60)
Glucose, Bld: 108 mg/dL — ABNORMAL HIGH (ref 70–99)
Potassium: 3.4 mmol/L — ABNORMAL LOW (ref 3.5–5.1)
Sodium: 137 mmol/L (ref 135–145)

## 2019-09-25 LAB — CBC
HCT: 37.1 % (ref 36.0–46.0)
Hemoglobin: 11.7 g/dL — ABNORMAL LOW (ref 12.0–15.0)
MCH: 30.1 pg (ref 26.0–34.0)
MCHC: 31.5 g/dL (ref 30.0–36.0)
MCV: 95.4 fL (ref 80.0–100.0)
Platelets: 229 10*3/uL (ref 150–400)
RBC: 3.89 MIL/uL (ref 3.87–5.11)
RDW: 13.1 % (ref 11.5–15.5)
WBC: 10.1 10*3/uL (ref 4.0–10.5)
nRBC: 0 % (ref 0.0–0.2)

## 2019-09-25 LAB — SARS CORONAVIRUS 2 BY RT PCR (HOSPITAL ORDER, PERFORMED IN ~~LOC~~ HOSPITAL LAB): SARS Coronavirus 2: NEGATIVE

## 2019-09-25 LAB — TROPONIN I (HIGH SENSITIVITY)
Troponin I (High Sensitivity): <2 ng/L (ref <18)
Troponin I (High Sensitivity): <2 ng/L (ref <18)

## 2019-09-25 LAB — I-STAT BETA HCG BLOOD, ED (MC, WL, AP ONLY): I-stat hCG, quantitative: <5 m[IU]/mL (ref <5)

## 2019-09-25 MED ORDER — BENZONATATE 100 MG PO CAPS
100.0000 mg | ORAL_CAPSULE | Freq: Three times a day (TID) | ORAL | 0 refills | Status: DC
Start: 2019-09-25 — End: 2019-10-09

## 2019-09-25 MED ORDER — IBUPROFEN 800 MG PO TABS
800.0000 mg | ORAL_TABLET | Freq: Once | ORAL | Status: AC
  Administered 2019-09-25: 800 mg via ORAL
  Filled 2019-09-25: qty 1

## 2019-09-25 MED ORDER — SODIUM CHLORIDE 0.9% FLUSH: 3.0000 mL | Freq: Once | INTRAVENOUS | Status: DC

## 2019-09-25 MED ORDER — BENZONATATE 100 MG PO CAPS
200.0000 mg | ORAL_CAPSULE | Freq: Once | ORAL | Status: AC
  Administered 2019-09-25: 200 mg via ORAL
  Filled 2019-09-25: qty 2

## 2019-09-25 MED ORDER — NAPROXEN 500 MG PO TABS
500.0000 mg | ORAL_TABLET | Freq: Two times a day (BID) | ORAL | 0 refills | Status: DC
Start: 2019-09-25 — End: 2019-10-09

## 2019-09-25 NOTE — ED Provider Notes (Signed)
[facility] EMERGENCY DEPARTMENT Provider Note   CSN: [id number] Arrival date & time: 09/25/19  1411     History Chief Complaint  Patient presents with  . Chest Pain    Allye Hoyos is a 24 y.o. female with no significant past medical history who presents to the ED due to intermittent central chest pain that started last night around 10 PM.  Patient states she experiences chest pain only when coughing or laughing.  She also admits to worsening chest pain when she rolls over or movement of her chest wall. Denies associated diaphoresis, nausea, and vomiting. She admits to having a dry cough for the past week.  Denies sick contacts and known Covid exposures.  Chest pain associated with shortness of breath and lightheadedness.  Patient states she feels short of breath due to chest wall pain when expanding her lungs.  Denies history of blood clots, recent surgeries, recent long immobilizations.  She has a Nexplanon for birth control.  Patient states lightheadedness is worse when standing up and bending forward.  She describes it as room spinning sensation and feeling off balance. Denies fever and chills. Denies lower extremity edema. She has tried benadryl for her symptoms with no relief.  Denies tobacco and drug abuse.  Patient has a scheduled appointment with her pulmonologist in August for further evaluation of her abnormal chest x-ray/CT scan.    History obtained from patient and past medical records. No interpreter used during encounter.      History reviewed. No pertinent past medical history.  Patient Active Problem List   Diagnosis Date Noted  . Nexplanon insertion 08/25/2018    History reviewed. No pertinent surgical history.   OB History    Gravida      Para      Term      Preterm      AB      Living  0     SAB      TAB      Ectopic      Multiple      Live Births              Family History  Problem Relation Age of Onset  . Stroke  Other   . Diabetes Other   . Hypertension Other   . Seizures Other     Social History   Tobacco Use  . Smoking status: Never Smoker  . Smokeless tobacco: Never Used  Substance Use Topics  . Alcohol use: No  . Drug use: No    Home Medications Prior to Admission medications   Medication Sig Start Date End Date Taking? Authorizing Provider  etonogestrel (NEXPLANON) 68 MG IMPL implant 1 each by Subdermal route once.   Yes [provider]  benzonatate (TESSALON) 100 MG capsule Take 1 capsule (100 mg total) by mouth every 8 (eight) hours. 09/25/19   Mannie Stabile, PA-C  metroNIDAZOLE (FLAGYL) 500 MG tablet Take 1 tablet (500 mg total) by mouth 2 (two) times daily. Patient not taking: Reported on 09/25/2019 08/08/19   Merrilee Jansky, MD  naproxen (NAPROSYN) 500 MG tablet Take 1 tablet (500 mg total) by mouth 2 (two) times daily. 09/25/19   Mannie Stabile, PA-C    Allergies    Patient has no known allergies.  Review of Systems   Review of Systems  Constitutional: Negative for chills and fever.  HENT: Negative for congestion and sore throat.   Respiratory: Positive for cough and shortness  of breath.   Cardiovascular: Positive for chest pain. Negative for leg swelling.  Gastrointestinal: Negative for abdominal pain, diarrhea, nausea and vomiting.  Neurological: Positive for light-headedness.  All other systems reviewed and are negative.   Physical Exam Updated Vital Signs BP 108/74   Pulse 85   Temp 97.9 F (36.6 C) (Oral)   Resp 20   Ht 5\' 6"  (1.676 m)   Wt 63.5 kg   SpO2 100%   BMI 22.60 kg/m   Physical Exam Vitals and nursing note reviewed.  Constitutional:      General: She is not in acute distress.    Appearance: She is not ill-appearing.  HENT:     Head: Normocephalic.  Eyes:     Pupils: Pupils are equal, round, and reactive to light.  Cardiovascular:     Rate and Rhythm: Normal rate and regular rhythm.     Pulses: Normal pulses.      Heart sounds: Normal heart sounds. No murmur heard.  No friction rub. No gallop.   Pulmonary:     Effort: Pulmonary effort is normal.     Breath sounds: Normal breath sounds.     Comments: Respirations equal and unlabored, patient able to speak in full sentences, lungs clear to auscultation bilaterally Chest:     Comments: Reproducible anterior chest wall tenderness.  No crepitus or deformity. Abdominal:     General: Abdomen is flat. Bowel sounds are normal. There is no distension.     Palpations: Abdomen is soft.     Tenderness: There is no abdominal tenderness. There is no guarding or rebound.  Musculoskeletal:     Cervical back: Neck supple.     Comments: No lower extremity edema.  Negative Homans' sign bilaterally.  No calf tenderness bilaterally.  Skin:    General: Skin is warm and dry.  Neurological:     General: No focal deficit present.     Mental Status: She is alert and oriented to person, place, and time.     Cranial Nerves: No cranial nerve deficit.     Motor: No weakness.     Gait: Gait normal.  Psychiatric:        Mood and Affect: Mood normal.        Behavior: Behavior normal.     ED Results / Procedures / Treatments   Labs (all labs ordered are listed, but only abnormal results are displayed) Labs Reviewed  BASIC METABOLIC PANEL - Abnormal; Notable for the following components:      Result Value   Potassium 3.4 (*)    Glucose, Bld 108 (*)    All other components within normal limits  CBC - Abnormal; Notable for the following components:   Hemoglobin 11.7 (*)    All other components within normal limits  SARS CORONAVIRUS 2 BY RT PCR (HOSPITAL ORDER, [facility])  I-STAT BETA HCG BLOOD, ED ([facility], [facility], [facility] ONLY)  TROPONIN I (HIGH SENSITIVITY)  TROPONIN I (HIGH SENSITIVITY)    EKG EKG Interpretation  Date/Time:  Monday September 25 2019 14:14:03 EDT Ventricular Rate:  92 PR Interval:  156 QRS Duration: 78 QT Interval:  354 QTC  Calculation: 437 R Axis:   62 Text Interpretation: Normal sinus rhythm Non-specific ST-t changes Confirmed by Virgel Manifold [id number]) on 09/25/2019 9:48:14 PM   Radiology DG Chest 2 View  Result Date: 09/25/2019 CLINICAL DATA:  Chest pain and shortness of breath for 2 days EXAM: CHEST - 2 VIEW COMPARISON:  04/17/2019  FINDINGS: Cardiac shadow is within normal limits. Persistent cavitary lesion with air-fluid level is noted within the right perihilar region. Additionally some new patchy infiltrate is noted in the more peripheral aspect of the right upper lobe. No sizable effusion is seen. No bony abnormality is noted. IMPRESSION: Persistent cavitary lesion with air-fluid level in the right perihilar region. Increased peripheral inflammatory change when compared with the prior study. Electronically Signed   By: Alcide Clever M.D.   On: 09/25/2019 15:30    Procedures Procedures (including critical care time)  Medications Ordered in ED Medications  sodium chloride flush (NS) 0.9 % injection 3 mL (has no administration in time range)  benzonatate (TESSALON) capsule 200 mg (200 mg Oral Given 09/25/19 2015)  ibuprofen (ADVIL) tablet 800 mg (800 mg Oral Given 09/25/19 2015)    ED Course  I have reviewed the triage vital signs and the nursing notes.  Pertinent labs & imaging results that were available during my care of the patient were reviewed by me and considered in my medical decision making (see chart for details).  Clinical Course as of Sep 25 2210  Mon Sep 25, 2019  2100 Troponin I (High Sensitivity): <2 [CA]  2154 SARS Coronavirus 2: NEGATIVE [CA]    Clinical Course User Index [CA] Mannie Stabile, PA-C   MDM Rules/Calculators/A&P                         24 year old female presents to the ED due to chest pain associated with shortness of breath and lightheadedness that started last night around 10 PM.  Patient states chest pain only occurs when coughing and laughing.  No history of  blood clots, recent surgeries, recent long immobilizations.  She has the Nexplanon for birth control.  Upon arrival, patient afebrile and was noted to be tachycardic at 103 however has not been tachycardic since.  O2 saturation at 99% on room air.  Patient in no acute distress and non-ill-appearing.  Physical exam reassuring.  Reproducible anterior chest wall tenderness.  No lower extremity edema or calf tenderness to adjust DVT.  Lungs clear to auscultation bilaterally. Chest pain appears atypical in nature. Suspect MSK etiology. Routine labs and troponin ordered at triage. Patient requesting COVID test. Order placed. Will also obtain orthostatic vitals to rule out orthostatic hypotension. Suspect symptoms related to a viral infection. Patient given tessalon and ibuprofen for symptomatic relief.   CBC reassuring with no leukocytosis.  Hemoglobin around baseline 11.7.  Initial troponin normal.  Will obtain delta troponin draw ACS.  BMP significant for mild hypokalemia 3.4, but otherwise reassuring. Pregnancy test negative. CXR personally reviewed which demonstrates: IMPRESSION:  Persistent cavitary lesion with air-fluid level in the right  perihilar region.    Increased peripheral inflammatory change when compared with the  prior study.   EKG personally reviewed which demonstrates normal sinus rhythm with no signs of acute ischemia.  Delta troponin flat.  Low suspicion for ACS.  Presentation nonconcerning for dissection. Apart from first HR, patient has not been tachycardic her entire ED stay. No hypoxia. No clinical signs of DVT on exam. Low suspicion for PE/DVT. Suspect chest pain related to MSK etiology given reproducible nature on exam. No orthostatic hypotension. Discussed oral hydration. Will treat symptomatically with naproxen and PCP follow-up.  Patient discharged with cough medication.  Instructed patient to follow-up with PCP if symptoms not improved within the next week.  Also advised patient  to follow-up with her pulmonologist for  further evaluation of her abnormal chest x-ray.  Strict ED precautions discussed with patient. Patient states understanding and agrees to plan. Patient discharged home in no acute distress and stable vitals.  Final Clinical Impression(s) / ED Diagnoses Final diagnoses:  Chest wall pain  Cough    Rx / DC Orders ED Discharge Orders         Ordered    naproxen (NAPROSYN) 500 MG tablet  2 times daily     Discontinue  Reprint     09/25/19 2156    benzonatate (TESSALON) 100 MG capsule  Every 8 hours     Discontinue  Reprint     09/25/19 2156           Mannie Stabile, PA-C 09/25/19 2212    Raeford Razor, MD 09/26/19 [phone]

## 2019-09-25 NOTE — ED Triage Notes (Signed)
Pt reports chest pain x2 days with sob and lightheadedness while at work yesterday. resp e/u, nad.

## 2019-09-25 NOTE — Discharge Instructions (Addendum)
As discussed, all of your labs are reassuring today.  I suspect your symptoms are related to musculoskeletal etiology.  I am sending home with pain medication and cough medication.  Take as prescribed.  Your Covid test is negative.  I suspect your cough is related to another viral infection.  Please follow-up with your pulmonologist for further evaluation of your abnormality on your chest x-ray.  Please follow-up with your PCP within the next week for further evaluation.  Return to the ER for new or worsening symptoms.

## 2019-09-27 DIAGNOSIS — R05 Cough: Secondary | ICD-10-CM | POA: Diagnosis not present

## 2019-09-27 DIAGNOSIS — J189 Pneumonia, unspecified organism: Secondary | ICD-10-CM | POA: Diagnosis not present

## 2019-10-09 ENCOUNTER — Encounter: Payer: Self-pay | Admitting: Internal Medicine

## 2019-10-09 ENCOUNTER — Other Ambulatory Visit: Payer: Self-pay

## 2019-10-09 ENCOUNTER — Ambulatory Visit (INDEPENDENT_AMBULATORY_CARE_PROVIDER_SITE_OTHER): Payer: BC Managed Care – PPO | Admitting: Internal Medicine

## 2019-10-09 VITALS — BP 94/62 | HR 63 | Ht 66.0 in | Wt 133.0 lb

## 2019-10-09 DIAGNOSIS — J479 Bronchiectasis, uncomplicated: Secondary | ICD-10-CM | POA: Diagnosis not present

## 2019-10-09 DIAGNOSIS — Z23 Encounter for immunization: Secondary | ICD-10-CM | POA: Diagnosis not present

## 2019-10-09 LAB — CBC WITH DIFFERENTIAL/PLATELET
Basophils Absolute: 0 10*3/uL (ref 0.0–0.1)
Basophils Relative: 0.6 % (ref 0.0–3.0)
Eosinophils Absolute: 0.1 10*3/uL (ref 0.0–0.7)
Eosinophils Relative: 2.6 % (ref 0.0–5.0)
HCT: 36.2 % (ref 36.0–46.0)
Hemoglobin: 11.7 g/dL — ABNORMAL LOW (ref 12.0–15.0)
Lymphocytes Relative: 37.8 % (ref 12.0–46.0)
Lymphs Abs: 1.8 10*3/uL (ref 0.7–4.0)
MCHC: 32.3 g/dL (ref 30.0–36.0)
MCV: 94.2 fl (ref 78.0–100.0)
Monocytes Absolute: 0.4 10*3/uL (ref 0.1–1.0)
Monocytes Relative: 7.9 % (ref 3.0–12.0)
Neutro Abs: 2.4 10*3/uL (ref 1.4–7.7)
Neutrophils Relative %: 51.1 % (ref 43.0–77.0)
Platelets: 270 10*3/uL (ref 150.0–400.0)
RBC: 3.84 Mil/uL — ABNORMAL LOW (ref 3.87–5.11)
RDW: 14 % (ref 11.5–15.5)
WBC: 4.7 10*3/uL (ref 4.0–10.5)

## 2019-10-09 LAB — SEDIMENTATION RATE: Sed Rate: 13 mm/hr (ref 0–20)

## 2019-10-09 NOTE — Patient Instructions (Addendum)
For cough congestion > mucinex or mucinex dm maximum dose is 1200 mg every 12 hours and flutter valve    Prevnar 13 today and consider the covid vaccination asap    Please remember to go to the lab department   for your tests - we will call you with the results when they are available.   Please schedule a follow up office visit in 6 weeks, call sooner if needed cxr on return

## 2019-10-09 NOTE — Progress Notes (Signed)
Pam Clark, female    DOB: 1995/09/22, 24 y.o.   MRN: [id number]   Brief patient profile:  23 yobf never smoker / ran track / basketball but "always" even as child  aware of spring /summer time "colds" / dx as allergic rhinitis better with zyrtec and feq  "steroid shot" late in season   ? 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 Pulmonary Consult    Referred by Dr Manson Passey- eval of lung lesion on cxr- pt has hx of PNA and bronchitis. She started having CP and SOB a wk ago.   Dyspnea:  Works in [employer] / up and down steps/  Cough: better now never really productive / does seem to rattle a bit but denies am flares Sleep: able to lie flat fine  SABA use:  Maybe helps the cough  No h/o aspiration/etoh excess or elicit drug use or dental problems / sleeps on back not prone.  No obvious day to day or daytime variability or assoc excess/ purulent sputum or mucus plugs or hemoptysis or  chest tightness, subjective wheeze or overt sinus or hb symptoms.   Sleeping ok flat  without nocturnal  or early am exacerbation  of respiratory  c/o's or need for noct saba. Also denies any obvious fluctuation of symptoms with weather or environmental changes or other aggravating or alleviating factors except as outlined above   No unusual exposure hx or h/o childhood pna/ asthma or knowledge of premature birth.  Current Allergies, Complete Past Medical History, Past Surgical History, Family History, and Social History were reviewed in [employer] [facility] record.  ROS  The following are not active complaints unless bolded Hoarseness, sore throat, dysphagia, dental problems, itching, sneezing,  nasal congestion or discharge of excess mucus or purulent secretions, ear ache,   fever, chills, sweats, unintended wt loss or wt gain, classically pleuritic or exertional cp,  orthopnea pnd or arm/hand swelling  or leg swelling, presyncope, palpitations, abdominal pain, anorexia, nausea, vomiting, diarrhea  or change in bowel habits or change in bladder habits, change in stools or change in urine, dysuria, hematuria,  rash, arthralgias, visual complaints, headache, numbness, weakness or ataxia or problems with walking or coordination,  change in mood or  memory.            No past medical history on file.  Outpatient Medications Prior to Visit  Medication Sig Dispense Refill  . albuterol (VENTOLIN HFA) 108 (90 Base) MCG/ACT inhaler Inhale 2 puffs into the lungs 4 (four) times daily as needed.    . etonogestrel (NEXPLANON) 68 MG IMPL implant 1 each by Subdermal route once.    . benzonatate (TESSALON) 100 MG capsule Take 1 capsule (100 mg total) by mouth every 8 (eight) hours. 21 capsule 0  . metroNIDAZOLE (FLAGYL) 500 MG tablet Take 1 tablet (500 mg total) by mouth 2 (two) times daily. (Patient not taking: Reported on 09/25/2019) 14 tablet 0  . naproxen (NAPROSYN) 500 MG tablet Take 1 tablet (500 mg total)  by mouth 2 (two) times daily. 30 tablet 0   No facility-administered medications prior to visit.     Objective:     BP 94/62 (BP Location: Left Arm, Cuff Size: Normal)   Pulse 63   Ht 5\' 6"  (1.676 m)   Wt 133 lb (60.3 kg)   SpO2 100% Comment: on RA  BMI 21.47 kg/m   SpO2: 100 % (on RA)   Pleasant healthy appearing amb bf nad   HEENT : pt wearing mask not removed for exam due to covid -19 concerns.    NECK :  without JVD/Nodes/TM/ nl carotid upstrokes bilaterally   LUNGS: no acc muscle use,  Nl contour chest which is  clear to A and P bilaterally without cough on insp or exp maneuvers   CV:  RRR  no s3 or murmur or increase in P2, and no edema   ABD:  soft and nontender with nl inspiratory excursion in the supine position. No bruits or organomegaly appreciated, bowel sounds nl  MS:  Nl gait/ ext warm without deformities, calf tenderness, cyanosis or clubbing No obvious joint restrictions   SKIN: warm and dry without lesions    NEURO:  alert, approp, nl sensorium with  no motor or cerebellar deficits apparent.    CXR PA and Lateral:      I personally reviewed images and agree with radiology impression as follows:    Persistent cavitary lesion with air-fluid level in the right perihilar region. Increased peripheral inflammatory change when compared with the prior study My review:  All isolated to the RUL    I personally reviewed images and agree with radiology impression as follows:   Chest CT 04/16/19 . Large cavity with air-fluid level and areas of cylindrical and varicose bronchiectasis in the right upper lobe with peribronchovascular ground-glass attenuation and micronodularity, presumably chronic given the presence of the thin-walled cavity in this region on remote prior chest x-ray from 12/10/2016. My review: again all isolated to RUL prox > distally and all in ant segment    Labs ordered 10/09/2019  :  allergy profile   Quant Gold TB  Quant Igs      Assessment   Bronchiectasis without complication (HCC)  Chest CT 04/16/19 . Large cavity with air-fluid level and areas of cylindrical and varicose bronchiectasis in the right upper lobe with peribronchovascular ground-glass attenuation and micronodularity, presumably chronic given the presence of the thin-walled cavity in this region on remote prior chest x-ray from 12/10/2016. - flutter valve training 10/09/2019  - Quant GOLD TB 10/09/2019 pending  - Quant IG's 10/09/2019 pending   Strongly suspect this is either congenital or assoc  with infection in childhood and not likely part of any kind of immune disorder or active infection like MAI  - the fact that is it is so far very well localized to the RUL ant segments supports this hypothesis and the possibility of a surgical approach here given the likelihood that retained secretions in the upper lobe risk contaminating the Lower lobe so :  >>> rec full vaccination for strep pneumo starting with Prevnar 13 today and pneomoccus in 1 year and repeat in 5-7 y  Pt informed of the seriousness of COVID 19 infection as a direct risk to lung health  and safey and to close contacts and should continue to wear a facemask in public and minimize exposure to public locations but especially avoid any area or activity where non-close contacts are not observing distancing or wearing an  appropriate face mask.  I strongly recommended she take either of the vaccines available through local drugstores based on updated information on millions of Americans treated with the Moderna and [employer] products  which have proven both safe and  effective even against the new delta variant.    PRN SABA/ mucuinex flutter valves   Check labs   F/u in 6 weeks with repeat cxr and consideral surgeryy referral at that time (vs Kozlow if any immune disorder uncovered)           Each maintenance medication was reviewed in detail including emphasizing most importantly the difference between maintenance and prns and under what circumstances the prns are to be triggered using an action plan format where appropriate.  Total time for H and P, chart review, counseling, teaching device (flutter valve)  and generating customized AVS unique to this office visit / charting = 60 min              Sandrea Hughs, MD 10/09/2019

## 2019-10-09 NOTE — Assessment & Plan Note (Addendum)
Chest CT 04/16/19 . Large cavity with air-fluid level and areas of cylindrical and varicose bronchiectasis in the right upper lobe with peribronchovascular ground-glass attenuation and micronodularity, presumably chronic given the presence of the thin-walled cavity in this region on remote prior chest x-ray from 12/10/2016. - flutter valve training 10/09/2019  - Quant GOLD TB 10/09/2019 pending  - Quant IG's 10/09/2019 pending   Strongly suspect this is either congenital or assoc with infection in childhood and not likely part of any kind of immune disorder or active infection like MAI  - the fact that is it is so far very well localized to the RUL ant segments supports this hypothesis and the possibility of a surgical approach here given the likelihood that retained secretions in the upper lobe risk contaminating the Lower lobe so :  >>> rec full vaccination for strep pneumo starting with Prevnar 13 today and pneomoccus in 1 year and repeat in 5-7 y  Pt informed of the seriousness of COVID 19 infection as a direct risk to lung health  and safey and to close contacts and should continue to wear a facemask in public and minimize exposure to public locations but especially avoid any area or activity where non-close contacts are not observing distancing or wearing an appropriate face mask.  I strongly recommended she take either of the vaccines available through local drugstores based on updated information on millions of Americans treated with the Moderna and [employer] products  which have proven both safe and  effective even against the new delta variant.    PRN SABA/ mucuinex flutter valves   Check labs   F/u in 6 weeks with repeat cxr and consideral surgeryy referral at that time (vs Kozlow if any immune disorder uncovered)           Each maintenance medication was reviewed in detail including emphasizing most importantly the difference between maintenance and prns and under what circumstances  the prns are to be triggered using an action plan format where appropriate.  Total time for H and P, chart review, counseling, teaching device (flutter valve)  and generating customized AVS unique to this office visit / charting = 60 min

## 2019-10-11 ENCOUNTER — Other Ambulatory Visit: Payer: Self-pay | Admitting: Internal Medicine

## 2019-10-11 DIAGNOSIS — J479 Bronchiectasis, uncomplicated: Secondary | ICD-10-CM

## 2019-10-11 LAB — IGE: IgE (Immunoglobulin E), Serum: 771 kU/L — ABNORMAL HIGH (ref <=114)

## 2019-10-11 LAB — IGG, IGA, IGM
IgG (Immunoglobin G), Serum: 1338 mg/dL (ref 600–1640)
IgM, Serum: 165 mg/dL (ref 50–300)
Immunoglobulin A: 242 mg/dL (ref 47–310)

## 2019-10-11 LAB — QUANTIFERON-TB GOLD PLUS
Mitogen-NIL: >10 IU/mL
NIL: 0.04 IU/mL
QuantiFERON-TB Gold Plus: NEGATIVE
TB1-NIL: 0 IU/mL
TB2-NIL: <0 IU/mL

## 2019-10-13 DIAGNOSIS — J479 Bronchiectasis, uncomplicated: Secondary | ICD-10-CM | POA: Diagnosis not present

## 2019-11-02 ENCOUNTER — Institutional Professional Consult (permissible substitution): Payer: BC Managed Care – PPO | Admitting: Pulmonary Disease

## 2019-11-28 ENCOUNTER — Encounter: Payer: Self-pay | Admitting: Internal Medicine

## 2019-11-28 ENCOUNTER — Ambulatory Visit (INDEPENDENT_AMBULATORY_CARE_PROVIDER_SITE_OTHER): Payer: BC Managed Care – PPO

## 2019-11-28 ENCOUNTER — Ambulatory Visit (INDEPENDENT_AMBULATORY_CARE_PROVIDER_SITE_OTHER): Payer: BC Managed Care – PPO | Admitting: Internal Medicine

## 2019-11-28 ENCOUNTER — Other Ambulatory Visit: Payer: Self-pay

## 2019-11-28 DIAGNOSIS — J479 Bronchiectasis, uncomplicated: Secondary | ICD-10-CM

## 2019-11-28 MED ORDER — AZITHROMYCIN 250 MG PO TABS: ORAL_TABLET | ORAL | 11 refills | Status: DC

## 2019-11-28 NOTE — Patient Instructions (Addendum)
I very strongly recommend you get the moderna or pfizer vaccine as soon as possible based on your risk of dying from the virus  and the proven safety and benefit of these vaccines against even the delta variant.  This can save your life as well as  those of your loved ones,  especially if they are also not vaccinated.     Please remember to go to the  x-ray department  for your tests - we will call you with the results when they are available    For nasty mucus >  zpak    Please schedule a follow up visit in 3 months but call sooner if needed  - ok to see me in [place] also

## 2019-11-28 NOTE — Progress Notes (Signed)
Pam Clark, female    DOB: 02/04/1996, 24 y.o.   MRN: [id number]   Brief patient profile:  23 yobf never smoker / ran track / basketball but "always" even as child  aware of spring /summer time "colds" / dx as allergic rhinitis better with zyrtec and feq  "steroid shot" late in season   ? 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      History of Present Illness  10/09/2019  Pulmonary/ 1st office eval/Pam Clark  Chief Complaint  Patient presents with  . Pulmonary Consult    Referred by Dr Manson Passey- eval of lung lesion on cxr- pt has hx of PNA and bronchitis. She started having CP and SOB a wk ago.   Dyspnea:  Works in [employer] / up and down steps/  Cough: better now never really productive / does seem to rattle a bit but denies am flares Sleep: able to lie flat fine  SABA use:  Maybe helps the cough  No h/o aspiration/etoh excess or elicit drug use or dental problems / sleeps on back not prone rec For cough congestion > mucinex or mucinex dm maximum dose is 1200 mg every 12 hours and flutter valve  Prevnar 13 today and consider the covid vaccination asap      11/28/2019  f/u ov/Pam Clark re: bronchiectasis / still not vaccinated   Chief Complaint  Patient presents with  . Follow-up    Denies any problems with breathing  Dyspnea:  Not limited by breathing from desired activities   Cough: none  Sleeping: able to lie flat one pillow  SABA use: not needing using flutter  02: none   No obvious day to day or daytime variability or assoc excess/ purulent sputum or mucus plugs or hemoptysis or cp  or chest tightness, subjective wheeze or overt sinus or hb symptoms.   Sleeping  without nocturnal  or early am exacerbation  of respiratory  c/o's or need for noct saba. Also denies any obvious fluctuation of symptoms with weather or environmental changes or other aggravating or alleviating factors except as outlined above   No unusual exposure hx or h/o childhood pna/ asthma or knowledge of premature birth.  Current Allergies, Complete Past Medical History, Past Surgical History, Family History, and Social History were reviewed in [employer] record.  ROS  The following are not active complaints unless bolded Hoarseness, sore throat, dysphagia, dental problems, itching, sneezing,  nasal congestion or discharge of excess mucus or purulent secretions, ear ache,   fever, chills, sweats, unintended wt loss or wt gain, classically pleuritic or exertional cp,  orthopnea pnd or arm/hand swelling  or leg swelling, presyncope, palpitations, abdominal pain, anorexia, nausea, vomiting, diarrhea  or change in bowel habits or change in bladder habits, change in stools or change in urine, dysuria, hematuria,  rash, arthralgias, visual complaints, headache, numbness, weakness or ataxia or problems with walking or coordination,  change in mood or  memory.        Current Meds  Medication Sig  . albuterol (VENTOLIN HFA) 108 (90  Base) MCG/ACT inhaler Inhale 2 puffs into the lungs 4 (four) times daily as needed.  . etonogestrel (NEXPLANON) 68 MG IMPL implant 1 each by Subdermal route once.           Objective:     amb bf nad   Wt Readings from Last 3 Encounters:  11/28/19 142 lb 9.6 oz (64.7 kg)  10/09/19 133 lb (60.3 kg)  09/25/19 140 lb (63.5 kg)     Vital signs reviewed - Note on arrival 11/28/2019  02 sats  99% on RA      HEENT : pt wearing mask not removed for exam due to covid -19 concerns.    NECK :  without JVD/Nodes/TM/ nl carotid upstrokes bilaterally   LUNGS: no  acc muscle use,  Nl contour chest which is clear to A and P bilaterally without cough on insp or exp maneuvers   CV:  RRR  no s3 or murmur or increase in P2, and no edema   ABD:  soft and nontender with nl inspiratory excursion in the supine position. No bruits or organomegaly appreciated, bowel sounds nl  MS:  Nl gait/ ext warm without deformities, calf tenderness, cyanosis or clubbing No obvious joint restrictions   SKIN: warm and dry without lesions    NEURO:  alert, approp, nl sensorium with  no motor or cerebellar deficits apparent.    CXR PA and Lateral:   11/28/2019 :    I personally reviewed images and agree with radiology impression as follows:    Persistent cavitary lesion with fluid level in the right perihilar region. Decreased surrounding ground-glass opacity since previous exam. Small infrahilar opacities may reflect atelectasis or minimal pneumonia.            Assessment

## 2019-11-29 ENCOUNTER — Encounter: Payer: Self-pay | Admitting: Internal Medicine

## 2019-11-29 NOTE — Assessment & Plan Note (Addendum)
First noted by cxr 11/2016  -  Chest CT 04/16/19 Large cavity with air-fluid level and areas of cylindrical and varicose bronchiectasis in the right upper lobe with peribronchovascular ground-glass attenuation and micronodularity, presumably chronic given the presence of the thin-walled cavity in this region on remote prior chest x-ray from 12/10/2016. - flutter valve training 10/09/2019   - Quant GOLD TB 10/09/2019  Neg  - Quant IG's 10/09/2019  Nl x IgE = 771  Eos 0.1   Back to baseline in terms of symptoms and cxr with obvious residual cyst in R perihilar area no longer clinically infected but obvious air fluid level with risk of recurrent spillage into rest of right lung   Rec: zpak prn change in mucus and escalate to stronger abx quickly if fails to clear mucus, preferably driven by culture data if she has time to come in to give specimen first. T surgery referral  Covid 19 vaccination asap   Discussed in detail all the  indications, usual  risks and alternatives  relative to the benefits with patient who agrees to proceed with w/u and rx as outlined.    Medical decision making was a moderate level of complexity in this case  requiring extra time for  H and P, chart review, counseling,   and generating customized AVS unique to this office visit and charting.   Each maintenance medication was reviewed in detail including emphasizing most importantly the difference between maintenance and prns and under what circumstances the prns are to be triggered using an action plan format where appropriate. Please see avs for details which were reviewed in writing by both me and my nurse and patient given a written copy highlighted where appropriate with yellow highlighter for the patient's continued care at home along with an updated version of their medications.  Patient was asked to maintain medication reconciliation by comparing this list to the actual medications being used at home and to contact this  office right away if there is a conflict or discrepancy.

## 2019-11-29 NOTE — Progress Notes (Signed)
Tried calling the pt and there was no answer- LMTCB.  

## 2019-12-05 ENCOUNTER — Other Ambulatory Visit: Payer: Self-pay | Admitting: Internal Medicine

## 2019-12-05 DIAGNOSIS — J479 Bronchiectasis, uncomplicated: Secondary | ICD-10-CM

## 2019-12-05 NOTE — Progress Notes (Signed)
Spoke with pt and notified of results per Dr. Sherene Sires. Pt verbalized understanding and denied any questions. She agreed to referral and I have made

## 2019-12-07 ENCOUNTER — Telehealth: Payer: Self-pay | Admitting: Internal Medicine

## 2019-12-07 DIAGNOSIS — J479 Bronchiectasis, uncomplicated: Secondary | ICD-10-CM

## 2019-12-07 NOTE — Telephone Encounter (Signed)
Ordered CT s contrast needs to be done prior to appt with Dr Dorris Fetch  - ordered

## 2019-12-18 ENCOUNTER — Ambulatory Visit (INDEPENDENT_AMBULATORY_CARE_PROVIDER_SITE_OTHER)
Admission: RE | Admit: 2019-12-18 | Discharge: 2019-12-18 | Disposition: A | Discharge status: Home / Self Care | Payer: BC Managed Care – PPO | Source: Ambulatory Visit | Attending: Internal Medicine | Admitting: Internal Medicine

## 2019-12-18 ENCOUNTER — Other Ambulatory Visit: Payer: Self-pay

## 2019-12-18 DIAGNOSIS — J479 Bronchiectasis, uncomplicated: Secondary | ICD-10-CM

## 2019-12-20 NOTE — Progress Notes (Signed)
Spoke with pt and notified of results per Dr. Wert. Pt verbalized understanding and denied any questions. 

## 2019-12-26 ENCOUNTER — Other Ambulatory Visit: Payer: Self-pay

## 2019-12-26 ENCOUNTER — Encounter: Payer: Self-pay | Admitting: Thoracic Surgery (Cardiothoracic Vascular Surgery)

## 2019-12-26 ENCOUNTER — Institutional Professional Consult (permissible substitution) (INDEPENDENT_AMBULATORY_CARE_PROVIDER_SITE_OTHER): Payer: BC Managed Care – PPO | Admitting: Thoracic Surgery (Cardiothoracic Vascular Surgery)

## 2019-12-26 VITALS — BP 102/67 | HR 80 | Temp 97.7°F | Resp 20 | Ht 66.0 in | Wt 145.0 lb

## 2019-12-26 DIAGNOSIS — J479 Bronchiectasis, uncomplicated: Secondary | ICD-10-CM
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
## 2020-01-09 ENCOUNTER — Ambulatory Visit (INDEPENDENT_AMBULATORY_CARE_PROVIDER_SITE_OTHER): Payer: BC Managed Care – PPO | Admitting: Thoracic Surgery (Cardiothoracic Vascular Surgery)

## 2020-01-09 ENCOUNTER — Other Ambulatory Visit: Payer: Self-pay

## 2020-01-09 VITALS — BP 107/73 | HR 70 | Resp 20 | Ht 66.0 in | Wt 145.0 lb

## 2020-01-09 DIAGNOSIS — J479 Bronchiectasis, uncomplicated: Secondary | ICD-10-CM
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
## 2020-01-15 ENCOUNTER — Other Ambulatory Visit: Payer: Self-pay | Admitting: *Deleted

## 2020-01-15 DIAGNOSIS — J479 Bronchiectasis, uncomplicated: Secondary | ICD-10-CM

## 2020-01-16 ENCOUNTER — Encounter: Payer: Self-pay | Admitting: *Deleted

## 2020-01-19 ENCOUNTER — Telehealth: Payer: Self-pay | Admitting: *Deleted

## 2020-01-19 NOTE — Telephone Encounter (Signed)
FMLA paperwork completed. Marylu Lund, pt's Aunt, was contacted to pick up her paperwork.

## 2020-01-24 NOTE — Pre-Procedure Instructions (Addendum)
Your procedure is scheduled on Monday, November 1st, from 07:30 AM- 11:00 AM.  Report to [facility] Main Entrance "A" at 05:30 A.M., and check in at the Admitting office.  Call this number if you have problems the morning of surgery:  [phone]    Remember:  Do not eat or drink after midnight the night before your surgery.     Take these medicines the morning of surgery:  albuterol (VENTOLIN HFA) inhaler- if needed (Please bring all inhalers with you the day of surgery)    As of today, STOP taking any Aspirin (unless otherwise instructed by your surgeon) Aleve, Naproxen, Ibuprofen, Motrin, Advil, Goody's, BC's, all herbal medications, fish oil, and all vitamins.           The Morning of Surgery:            Do not wear jewelry, make up, or nail polish.            Do not wear lotions, powders, perfumes, or deodorant.            Do not shave 48 hours prior to surgery.              Do not bring valuables to the hospital.            [facility] is not responsible for any belongings or valuables.  Do NOT Smoke (Tobacco/Vaping) or drink Alcohol 24 hours prior to your procedure. If you use a CPAP at night, you may bring all equipment for your overnight stay.   Contacts, glasses, dentures or bridgework may not be worn into surgery.      For patients admitted to the hospital, discharge time will be determined by your treatment team.   Patients discharged the day of surgery will not be allowed to drive home, and someone needs to stay with them for 24 hours.    Special instructions:   [place]- Preparing For Surgery  Before surgery, you can play an important role. Because skin is not sterile, your skin needs to be as free of germs as possible. You can reduce the number of germs on your skin by washing with CHG (chlorahexidine gluconate) Soap before surgery.  CHG is an antiseptic cleaner which kills germs and bonds with the skin to continue killing germs even after washing.     Oral Hygiene is also important to reduce your risk of infection.  Remember - BRUSH YOUR TEETH THE MORNING OF SURGERY WITH YOUR REGULAR TOOTHPASTE  Please do not use if you have an allergy to CHG or antibacterial soaps. If your skin becomes reddened/irritated stop using the CHG.  Do not shave (including legs and underarms) for at least 48 hours prior to first CHG shower. It is OK to shave your face.  Please follow these instructions carefully.   1. Shower the NIGHT BEFORE SURGERY and the MORNING OF SURGERY with CHG Soap.   2. If you chose to wash your hair, wash your hair first as usual with your normal shampoo.  3. After you shampoo, rinse your hair and body thoroughly to remove the shampoo.  4. Use CHG as you would any other liquid soap. You can apply CHG directly to the skin and wash gently with a scrungie or a clean washcloth.   5. Apply the CHG Soap to your body ONLY FROM THE NECK DOWN.  Do not use on open wounds or open sores. Avoid contact with your eyes, ears, mouth and genitals (private parts).  Wash Face and genitals (private parts)  with your normal soap.   6. Wash thoroughly, paying special attention to the area where your surgery will be performed.  7. Thoroughly rinse your body with warm water from the neck down.  8. DO NOT shower/wash with your normal soap after using and rinsing off the CHG Soap.  9. Pat yourself dry with a CLEAN TOWEL.  10. Wear CLEAN PAJAMAS to bed the night before surgery  11. Place CLEAN SHEETS on your bed the night of your first shower and DO NOT SLEEP WITH PETS.   Day of Surgery: SHOWER Wear Clean/Comfortable clothing the morning of surgery Do not apply any deodorants/lotions.   Remember to brush your teeth WITH YOUR REGULAR TOOTHPASTE.   Please read over the following fact sheets that you were given.

## 2020-01-25 ENCOUNTER — Encounter (HOSPITAL_COMMUNITY)
Admission: RE | Admit: 2020-01-25 | Discharge: 2020-01-25 | Disposition: A | Discharge status: Home / Self Care | Payer: BC Managed Care – PPO | Source: Ambulatory Visit | Attending: Thoracic Surgery (Cardiothoracic Vascular Surgery) | Admitting: Thoracic Surgery (Cardiothoracic Vascular Surgery)

## 2020-01-25 ENCOUNTER — Ambulatory Visit (HOSPITAL_COMMUNITY)
Admission: RE | Admit: 2020-01-25 | Discharge: 2020-01-25 | Disposition: A | Discharge status: Home / Self Care | Payer: BC Managed Care – PPO | Source: Ambulatory Visit | Attending: Thoracic Surgery (Cardiothoracic Vascular Surgery) | Admitting: Thoracic Surgery (Cardiothoracic Vascular Surgery)

## 2020-01-25 ENCOUNTER — Other Ambulatory Visit: Payer: Self-pay

## 2020-01-25 ENCOUNTER — Encounter (HOSPITAL_COMMUNITY): Payer: Self-pay

## 2020-01-25 ENCOUNTER — Other Ambulatory Visit (HOSPITAL_COMMUNITY)
Admission: RE | Admit: 2020-01-25 | Discharge: 2020-01-25 | Disposition: A | Discharge status: Home / Self Care | Payer: BC Managed Care – PPO | Source: Ambulatory Visit | Attending: Thoracic Surgery (Cardiothoracic Vascular Surgery) | Admitting: Thoracic Surgery (Cardiothoracic Vascular Surgery)

## 2020-01-25 DIAGNOSIS — Z20822 Contact with and (suspected) exposure to covid-19: Secondary | ICD-10-CM | POA: Diagnosis not present

## 2020-01-25 DIAGNOSIS — Z01818 Encounter for other preprocedural examination: Secondary | ICD-10-CM | POA: Insufficient documentation

## 2020-01-25 DIAGNOSIS — J479 Bronchiectasis, uncomplicated: Secondary | ICD-10-CM

## 2020-01-25 HISTORY — DX: Pneumonia, unspecified organism: J18.9

## 2020-01-25 HISTORY — DX: Dyspnea, unspecified: R06.00

## 2020-01-25 LAB — COMPREHENSIVE METABOLIC PANEL
ALT: 17 U/L (ref 0–44)
AST: 16 U/L (ref 15–41)
Albumin: 4.1 g/dL (ref 3.5–5.0)
Alkaline Phosphatase: 71 U/L (ref 38–126)
Anion gap: 9 (ref 5–15)
BUN: 20 mg/dL (ref 6–20)
CO2: 23 mmol/L (ref 22–32)
Calcium: 9.1 mg/dL (ref 8.9–10.3)
Chloride: 108 mmol/L (ref 98–111)
Creatinine, Ser: 0.71 mg/dL (ref 0.44–1.00)
GFR, Estimated: >60 mL/min (ref >60)
Glucose, Bld: 96 mg/dL (ref 70–99)
Potassium: 3.5 mmol/L (ref 3.5–5.1)
Sodium: 140 mmol/L (ref 135–145)
Total Bilirubin: 0.6 mg/dL (ref 0.3–1.2)
Total Protein: 6.8 g/dL (ref 6.5–8.1)

## 2020-01-25 LAB — URINALYSIS, ROUTINE W REFLEX MICROSCOPIC
Bilirubin Urine: NEGATIVE
Glucose, UA: NEGATIVE mg/dL
Hgb urine dipstick: NEGATIVE
Ketones, ur: NEGATIVE mg/dL
Leukocytes,Ua: NEGATIVE
Nitrite: NEGATIVE
Protein, ur: NEGATIVE mg/dL
Specific Gravity, Urine: 1.027 (ref 1.005–1.030)
pH: 7 (ref 5.0–8.0)

## 2020-01-25 LAB — CBC
HCT: 34.7 % — ABNORMAL LOW (ref 36.0–46.0)
Hemoglobin: 11.2 g/dL — ABNORMAL LOW (ref 12.0–15.0)
MCH: 31.5 pg (ref 26.0–34.0)
MCHC: 32.3 g/dL (ref 30.0–36.0)
MCV: 97.5 fL (ref 80.0–100.0)
Platelets: 216 10*3/uL (ref 150–400)
RBC: 3.56 MIL/uL — ABNORMAL LOW (ref 3.87–5.11)
RDW: 12.4 % (ref 11.5–15.5)
WBC: 5.6 10*3/uL (ref 4.0–10.5)
nRBC: 0 % (ref 0.0–0.2)

## 2020-01-25 LAB — BLOOD GAS, ARTERIAL
Acid-base deficit: 1.5 mmol/L (ref 0.0–2.0)
Bicarbonate: 22.3 mmol/L (ref 20.0–28.0)
FIO2: 21
O2 Saturation: 98.6 %
Patient temperature: 37
pCO2 arterial: 34.5 mmHg (ref 32.0–48.0)
pH, Arterial: 7.425 (ref 7.350–7.450)
pO2, Arterial: 108 mmHg (ref 83.0–108.0)

## 2020-01-25 LAB — SARS CORONAVIRUS 2 (TAT 6-24 HRS): SARS Coronavirus 2: NEGATIVE

## 2020-01-25 LAB — APTT: aPTT: 31 seconds (ref 24–36)

## 2020-01-25 LAB — PROTIME-INR
INR: 1.1 (ref 0.8–1.2)
Prothrombin Time: 13.5 seconds (ref 11.4–15.2)

## 2020-01-25 NOTE — Progress Notes (Signed)
PCP - Terie Purser, MD Cardiologist - N/A  Chest x-ray - 01/25/20 EKG - 01/25/20 Stress Test - denies ECHO - denies Cardiac Cath - denies  Sleep Study - denies CPAP - denies  Blood Thinner Instructions:N/A Aspirin Instructions:N/A  COVID TEST- 01/25/20   Anesthesia review:   Patient denies shortness of breath, fever, cough and chest pain at PAT appointment   All instructions explained to the patient, with a verbal understanding of the material. Patient agrees to go over the instructions while at home for a better understanding. Patient also instructed to self quarantine after being tested for COVID-19. The opportunity to ask questions was provided.    Coronavirus Screening  Have you experienced the following symptoms:  Cough yes/no: No Fever (>100.72F)  yes/no: No Runny nose yes/no: No Sore throat yes/no: No Difficulty breathing/shortness of breath  yes/no: No  Have you or a family member traveled in the last 14 days and where? yes/no: No   If the patient indicates "YES" to the above questions, their PAT will be rescheduled to limit the exposure to others and, the surgeon will be notified. THE PATIENT WILL NEED TO BE ASYMPTOMATIC FOR 14 DAYS.   If the patient is not experiencing any of these symptoms, the PAT nurse will instruct them to NOT bring anyone with them to their appointment since they may have these symptoms or traveled as well.   Please remind your patients and families that hospital visitation restrictions are in effect and the importance of the restrictions.

## 2020-01-26 NOTE — Anesthesia Preprocedure Evaluation (Addendum)
Anesthesia Evaluation  Patient identified by MRN, date of birth, ID band Patient awake    Reviewed: Allergy & Precautions, NPO status , Patient's Chart, lab work & pertinent test results  Airway Mallampati: II  TM Distance: >3 FB Neck ROM: Full    Dental  (+) Teeth Intact, Dental Advisory Given   Pulmonary    breath sounds clear to auscultation       Cardiovascular negative cardio ROS   Rhythm:Regular Rate:Normal     Neuro/Psych negative neurological ROS  negative psych ROS   GI/Hepatic negative GI ROS, Neg liver ROS,   Endo/Other  negative endocrine ROS  Renal/GU negative Renal ROS     Musculoskeletal negative musculoskeletal ROS (+)   Abdominal Normal abdominal exam  (+)   Peds  Hematology negative hematology ROS (+)   Anesthesia Other Findings   Reproductive/Obstetrics                            Anesthesia Physical Anesthesia Plan  ASA: II  Anesthesia Plan: General   Post-op Pain Management:    Induction: Intravenous  PONV Risk Score and Plan: 4 or greater and Ondansetron, Dexamethasone, Midazolam and Scopolamine patch - Pre-op  Airway Management Planned: Double Lumen EBT  Additional Equipment: Arterial line  Intra-op Plan:   Post-operative Plan: Extubation in OR  Informed Consent: I have reviewed the patients History and Physical, chart, labs and discussed the procedure including the risks, benefits and alternatives for the proposed anesthesia with the patient or authorized representative who has indicated his/her understanding and acceptance.       Plan Discussed with: CRNA  Anesthesia Plan Comments: (See PAT note by Antionette Poles, PA-C )      Anesthesia Quick Evaluation

## 2020-01-26 NOTE — Progress Notes (Signed)
Anesthesia Chart Review:  Chart reviewed for history of abnormal EKG.  Review of EKGs dating back to 2018 consistently show some amount of anterior T wave inversion.  Tracings reviewed with Dr. Okey Dupre.  He advised okay to proceed as planned.  Preop labs reviewed, mild anemia with hemoglobin 11.2, otherwise unremarkable.  EKG 01/17/2020:Normal sinus rhythm with sinus arrhythmia.  Rate 65. T wave abnormality, consider anterior ischemia.  No significant change noted from previous.   Pam Clark [facility]/Anesthesiology Phone [phone] 01/26/2020 9:52 AM

## 2020-01-29 ENCOUNTER — Inpatient Hospital Stay (HOSPITAL_COMMUNITY)
Admission: RE | Admit: 2020-01-29 | Discharge: 2020-02-02 | DRG: 164 | Disposition: A | Discharge status: Home / Self Care | Payer: BC Managed Care – PPO | Attending: Thoracic Surgery (Cardiothoracic Vascular Surgery) | Admitting: Thoracic Surgery (Cardiothoracic Vascular Surgery)

## 2020-01-29 ENCOUNTER — Inpatient Hospital Stay (HOSPITAL_COMMUNITY): Payer: BC Managed Care – PPO

## 2020-01-29 ENCOUNTER — Encounter (HOSPITAL_COMMUNITY): Payer: Self-pay | Admitting: Thoracic Surgery (Cardiothoracic Vascular Surgery)

## 2020-01-29 ENCOUNTER — Encounter (HOSPITAL_COMMUNITY)
Admission: RE | Disposition: A | Discharge status: Home / Self Care | Payer: Self-pay | Source: Home / Self Care | Attending: Thoracic Surgery (Cardiothoracic Vascular Surgery)

## 2020-01-29 ENCOUNTER — Inpatient Hospital Stay (HOSPITAL_COMMUNITY): Payer: BC Managed Care – PPO | Admitting: Certified Registered Nurse Anesthetist

## 2020-01-29 ENCOUNTER — Inpatient Hospital Stay (HOSPITAL_COMMUNITY): Payer: BC Managed Care – PPO | Admitting: Physician Assistant

## 2020-01-29 DIAGNOSIS — J479 Bronchiectasis, uncomplicated: Principal | ICD-10-CM | POA: Diagnosis present

## 2020-01-29 DIAGNOSIS — Y838 Other surgical procedures as the cause of abnormal reaction of the patient, or of later complication, without mention of misadventure at the time of the procedure: Secondary | ICD-10-CM | POA: Diagnosis not present

## 2020-01-29 DIAGNOSIS — J95812 Postprocedural air leak: Secondary | ICD-10-CM | POA: Diagnosis not present

## 2020-01-29 DIAGNOSIS — J984 Other disorders of lung: Secondary | ICD-10-CM | POA: Diagnosis present

## 2020-01-29 DIAGNOSIS — Z4682 Encounter for fitting and adjustment of non-vascular catheter: Secondary | ICD-10-CM

## 2020-01-29 DIAGNOSIS — Z96649 Presence of unspecified artificial hip joint: Secondary | ICD-10-CM | POA: Diagnosis present

## 2020-01-29 DIAGNOSIS — Z902 Acquired absence of lung [part of]: Secondary | ICD-10-CM

## 2020-01-29 DIAGNOSIS — J939 Pneumothorax, unspecified: Secondary | ICD-10-CM

## 2020-01-29 HISTORY — PX: INTERCOSTAL NERVE BLOCK: SHX5021

## 2020-01-29 LAB — TYPE AND SCREEN
ABO/RH(D): B POS
Antibody Screen: POSITIVE

## 2020-01-29 LAB — POCT PREGNANCY, URINE: Preg Test, Ur: NEGATIVE

## 2020-01-29 SURGERY — LOBECTOMY, LUNG, ROBOT-ASSISTED, USING VATS
Anesthesia: General | Site: Chest | Laterality: Right

## 2020-01-29 MED ORDER — AMISULPRIDE (ANTIEMETIC) 5 MG/2ML IV SOLN: 10.0000 mg | Freq: Once | INTRAVENOUS | Status: DC | PRN

## 2020-01-29 MED ORDER — DEXAMETHASONE SODIUM PHOSPHATE 10 MG/ML IJ SOLN
INTRAMUSCULAR | Status: AC
  Filled 2020-01-29: qty 1

## 2020-01-29 MED ORDER — CEFAZOLIN SODIUM-DEXTROSE 2-4 GM/100ML-% IV SOLN
INTRAVENOUS | Status: AC
  Filled 2020-01-29: qty 100

## 2020-01-29 MED ORDER — MIDAZOLAM HCL 2 MG/2ML IJ SOLN
INTRAMUSCULAR | Status: AC
  Filled 2020-01-29: qty 2

## 2020-01-29 MED ORDER — ORAL CARE MOUTH RINSE: 15.0000 mL | Freq: Once | OROMUCOSAL | Status: AC

## 2020-01-29 MED ORDER — SCOPOLAMINE 1 MG/3DAYS TD PT72
MEDICATED_PATCH | TRANSDERMAL | Status: DC | PRN
  Administered 2020-01-29: 1 via TRANSDERMAL

## 2020-01-29 MED ORDER — SUGAMMADEX SODIUM 200 MG/2ML IV SOLN
INTRAVENOUS | Status: DC | PRN
  Administered 2020-01-29: 200 mg via INTRAVENOUS

## 2020-01-29 MED ORDER — PHENYLEPHRINE 40 MCG/ML (10ML) SYRINGE FOR IV PUSH (FOR BLOOD PRESSURE SUPPORT)
PREFILLED_SYRINGE | INTRAVENOUS | Status: AC
  Filled 2020-01-29: qty 10

## 2020-01-29 MED ORDER — PROPOFOL 10 MG/ML IV BOLUS
INTRAVENOUS | Status: AC
  Filled 2020-01-29: qty 40

## 2020-01-29 MED ORDER — CEFAZOLIN SODIUM-DEXTROSE 2-4 GM/100ML-% IV SOLN
2.0000 g | INTRAVENOUS | Status: AC
  Administered 2020-01-29: 2 g via INTRAVENOUS

## 2020-01-29 MED ORDER — SODIUM CHLORIDE FLUSH 0.9 % IV SOLN
INTRAVENOUS | Status: DC | PRN
  Administered 2020-01-29: 96 mL

## 2020-01-29 MED ORDER — CHLORHEXIDINE GLUCONATE 0.12 % MT SOLN: 15.0000 mL | Freq: Once | OROMUCOSAL | Status: AC

## 2020-01-29 MED ORDER — ONDANSETRON HCL 4 MG/2ML IJ SOLN
INTRAMUSCULAR | Status: AC
  Filled 2020-01-29: qty 2

## 2020-01-29 MED ORDER — MIDAZOLAM HCL 2 MG/2ML IJ SOLN
INTRAMUSCULAR | Status: DC | PRN
  Administered 2020-01-29: 2 mg via INTRAVENOUS

## 2020-01-29 MED ORDER — FENTANYL CITRATE (PF) 250 MCG/5ML IJ SOLN
INTRAMUSCULAR | Status: DC | PRN
  Administered 2020-01-29: 100 ug via INTRAVENOUS
  Administered 2020-01-29 (×3): 50 ug via INTRAVENOUS

## 2020-01-29 MED ORDER — DEXMEDETOMIDINE (PRECEDEX) IN NS 20 MCG/5ML (4 MCG/ML) IV SYRINGE
PREFILLED_SYRINGE | INTRAVENOUS | Status: DC | PRN
  Administered 2020-01-29: 12 ug via INTRAVENOUS

## 2020-01-29 MED ORDER — LIDOCAINE 2% (20 MG/ML) 5 ML SYRINGE
INTRAMUSCULAR | Status: AC
  Filled 2020-01-29: qty 5

## 2020-01-29 MED ORDER — SENNOSIDES-DOCUSATE SODIUM 8.6-50 MG PO TABS
1.0000 | ORAL_TABLET | Freq: Every day | ORAL | Status: DC
  Administered 2020-01-29 – 2020-02-01 (×3): 1 via ORAL
  Filled 2020-01-29 (×3): qty 1

## 2020-01-29 MED ORDER — SODIUM CHLORIDE 0.9 % IR SOLN
Status: DC | PRN
  Administered 2020-01-29 (×2): 1000 mL

## 2020-01-29 MED ORDER — ACETAMINOPHEN 325 MG PO TABS: 325.0000 mg | ORAL_TABLET | Freq: Once | ORAL | Status: DC | PRN

## 2020-01-29 MED ORDER — DEXAMETHASONE SODIUM PHOSPHATE 10 MG/ML IJ SOLN
INTRAMUSCULAR | Status: DC | PRN
  Administered 2020-01-29: 10 mg via INTRAVENOUS

## 2020-01-29 MED ORDER — KETOROLAC TROMETHAMINE 15 MG/ML IJ SOLN
15.0000 mg | Freq: Four times a day (QID) | INTRAMUSCULAR | Status: DC
  Administered 2020-01-29 – 2020-01-30 (×4): 15 mg via INTRAVENOUS
  Filled 2020-01-29 (×5): qty 1

## 2020-01-29 MED ORDER — ROCURONIUM BROMIDE 10 MG/ML (PF) SYRINGE
PREFILLED_SYRINGE | INTRAVENOUS | Status: DC | PRN
  Administered 2020-01-29: 60 mg via INTRAVENOUS
  Administered 2020-01-29: 20 mg via INTRAVENOUS
  Administered 2020-01-29: 40 mg via INTRAVENOUS

## 2020-01-29 MED ORDER — ACETAMINOPHEN 10 MG/ML IV SOLN: 1000.0000 mg | Freq: Once | INTRAVENOUS | Status: DC | PRN

## 2020-01-29 MED ORDER — PHENYLEPHRINE HCL-NACL 10-0.9 MG/250ML-% IV SOLN
INTRAVENOUS | Status: DC | PRN
  Administered 2020-01-29: 10 ug/min via INTRAVENOUS

## 2020-01-29 MED ORDER — PROPOFOL 10 MG/ML IV BOLUS
INTRAVENOUS | Status: DC | PRN
  Administered 2020-01-29: 130 mg via INTRAVENOUS

## 2020-01-29 MED ORDER — SCOPOLAMINE 1 MG/3DAYS TD PT72
MEDICATED_PATCH | TRANSDERMAL | Status: AC
  Filled 2020-01-29: qty 1

## 2020-01-29 MED ORDER — FENTANYL CITRATE (PF) 250 MCG/5ML IJ SOLN
INTRAMUSCULAR | Status: AC
  Filled 2020-01-29: qty 10

## 2020-01-29 MED ORDER — CHLORHEXIDINE GLUCONATE CLOTH 2 % EX PADS
6.0000 | MEDICATED_PAD | Freq: Every day | CUTANEOUS | Status: DC
  Administered 2020-01-29 – 2020-02-02 (×4): 6 via TOPICAL

## 2020-01-29 MED ORDER — ROCURONIUM BROMIDE 10 MG/ML (PF) SYRINGE
PREFILLED_SYRINGE | INTRAVENOUS | Status: AC
  Filled 2020-01-29: qty 10

## 2020-01-29 MED ORDER — SODIUM CHLORIDE 0.9 % IV SOLN: INTRAVENOUS | Status: DC

## 2020-01-29 MED ORDER — MEPERIDINE HCL 25 MG/ML IJ SOLN: 6.2500 mg | INTRAMUSCULAR | Status: DC | PRN

## 2020-01-29 MED ORDER — BISACODYL 5 MG PO TBEC
10.0000 mg | DELAYED_RELEASE_TABLET | Freq: Every day | ORAL | Status: DC
  Administered 2020-01-29 – 2020-02-02 (×2): 10 mg via ORAL
  Filled 2020-01-29 (×4): qty 2

## 2020-01-29 MED ORDER — ALBUTEROL SULFATE (2.5 MG/3ML) 0.083% IN NEBU
2.5000 mg | INHALATION_SOLUTION | RESPIRATORY_TRACT | Status: DC
  Administered 2020-01-29 – 2020-01-30 (×3): 2.5 mg via RESPIRATORY_TRACT
  Filled 2020-01-29 (×3): qty 3

## 2020-01-29 MED ORDER — HYDROMORPHONE HCL 1 MG/ML IJ SOLN
INTRAMUSCULAR | Status: AC
  Administered 2020-01-29: 0.5 mg via INTRAVENOUS
  Filled 2020-01-29: qty 1

## 2020-01-29 MED ORDER — ENOXAPARIN SODIUM 40 MG/0.4ML ~~LOC~~ SOLN
40.0000 mg | Freq: Every day | SUBCUTANEOUS | Status: DC
  Administered 2020-01-29 – 2020-02-02 (×2): 40 mg via SUBCUTANEOUS
  Filled 2020-01-29 (×5): qty 0.4

## 2020-01-29 MED ORDER — BUPIVACAINE HCL (PF) 0.5 % IJ SOLN
INTRAMUSCULAR | Status: AC
  Filled 2020-01-29: qty 30

## 2020-01-29 MED ORDER — OXYCODONE HCL 5 MG PO TABS
5.0000 mg | ORAL_TABLET | ORAL | Status: DC | PRN
  Administered 2020-01-30 – 2020-01-31 (×4): 10 mg via ORAL
  Administered 2020-02-01 (×2): 5 mg via ORAL
  Administered 2020-02-02: 10 mg via ORAL
  Filled 2020-01-29 (×2): qty 2
  Filled 2020-01-29 (×2): qty 1
  Filled 2020-01-29 (×3): qty 2

## 2020-01-29 MED ORDER — HYDROMORPHONE HCL 1 MG/ML IJ SOLN
0.2500 mg | INTRAMUSCULAR | Status: DC | PRN
  Administered 2020-01-29: 0.5 mg via INTRAVENOUS

## 2020-01-29 MED ORDER — CEFAZOLIN SODIUM-DEXTROSE 2-4 GM/100ML-% IV SOLN
2.0000 g | Freq: Three times a day (TID) | INTRAVENOUS | Status: AC
  Administered 2020-01-29 (×2): 2 g via INTRAVENOUS
  Filled 2020-01-29 (×2): qty 100

## 2020-01-29 MED ORDER — ONDANSETRON HCL 4 MG/2ML IJ SOLN
4.0000 mg | Freq: Four times a day (QID) | INTRAMUSCULAR | Status: DC | PRN
  Administered 2020-01-29 – 2020-02-02 (×3): 4 mg via INTRAVENOUS
  Filled 2020-01-29 (×3): qty 2

## 2020-01-29 MED ORDER — LACTATED RINGERS IV SOLN: INTRAVENOUS | Status: DC

## 2020-01-29 MED ORDER — ONDANSETRON HCL 4 MG/2ML IJ SOLN
INTRAMUSCULAR | Status: DC | PRN
  Administered 2020-01-29: 4 mg via INTRAVENOUS

## 2020-01-29 MED ORDER — ACETAMINOPHEN 160 MG/5ML PO SOLN: 1000.0000 mg | Freq: Four times a day (QID) | ORAL | Status: DC

## 2020-01-29 MED ORDER — BUPIVACAINE LIPOSOME 1.3 % IJ SUSP
20.0000 mL | INTRAMUSCULAR | Status: DC
  Filled 2020-01-29: qty 20

## 2020-01-29 MED ORDER — ACETAMINOPHEN 160 MG/5ML PO SOLN: 325.0000 mg | Freq: Once | ORAL | Status: DC | PRN

## 2020-01-29 MED ORDER — LIDOCAINE 2% (20 MG/ML) 5 ML SYRINGE
INTRAMUSCULAR | Status: DC | PRN
  Administered 2020-01-29: 40 mg via INTRAVENOUS

## 2020-01-29 MED ORDER — ACETAMINOPHEN 10 MG/ML IV SOLN
INTRAVENOUS | Status: AC
  Administered 2020-01-29: 1000 mg via INTRAVENOUS
  Filled 2020-01-29: qty 100

## 2020-01-29 MED ORDER — CHLORHEXIDINE GLUCONATE 0.12 % MT SOLN
OROMUCOSAL | Status: AC
  Administered 2020-01-29: 15 mL via OROMUCOSAL
  Filled 2020-01-29: qty 15

## 2020-01-29 MED ORDER — KETOROLAC TROMETHAMINE 30 MG/ML IJ SOLN
INTRAMUSCULAR | Status: DC | PRN
  Administered 2020-01-29: 30 mg via INTRAVENOUS

## 2020-01-29 MED ORDER — 0.9 % SODIUM CHLORIDE (POUR BTL) OPTIME
TOPICAL | Status: DC | PRN
  Administered 2020-01-29: 2000 mL

## 2020-01-29 MED ORDER — KETOROLAC TROMETHAMINE 30 MG/ML IJ SOLN
INTRAMUSCULAR | Status: AC
  Filled 2020-01-29: qty 1

## 2020-01-29 MED ORDER — ACETAMINOPHEN 500 MG PO TABS
1000.0000 mg | ORAL_TABLET | Freq: Four times a day (QID) | ORAL | Status: DC
  Administered 2020-01-29 – 2020-02-02 (×15): 1000 mg via ORAL
  Filled 2020-01-29 (×15): qty 2

## 2020-01-29 SURGICAL SUPPLY — 134 items
ADH SKN CLS APL DERMABOND .7 (GAUZE/BANDAGES/DRESSINGS) ×1
APPLIER CLIP ROT 10 11.4 M/L (STAPLE)
APR CLP MED LRG 11.4X10 (STAPLE)
BAG TISS RTRVL C300 12X14 (MISCELLANEOUS) ×1
BLADE CLIPPER SURG (BLADE) IMPLANT
BLADE SURG SZ11 CARB STEEL (BLADE) ×3 IMPLANT
BNDG COHESIVE 6X5 TAN STRL LF (GAUZE/BANDAGES/DRESSINGS) IMPLANT
CANISTER SUCT 3000ML PPV (MISCELLANEOUS) ×6 IMPLANT
CANNULA REDUC XI 12-8 STAPL (CANNULA) ×4
CANNULA REDUC XI 12-8MM STAPL (CANNULA) ×2
CANNULA REDUCER 12-8 DVNC XI (CANNULA) ×2 IMPLANT
CATH THORACIC 28FR (CATHETERS) IMPLANT
CATH THORACIC 28FR RT ANG (CATHETERS) IMPLANT
CATH THORACIC 36FR (CATHETERS) IMPLANT
CATH THORACIC 36FR RT ANG (CATHETERS) IMPLANT
CLIP APPLIE ROT 10 11.4 M/L (STAPLE) IMPLANT
CLIP TI WIDE RED SMALL 6 (CLIP) ×6 IMPLANT
CLIP VESOCCLUDE MED 6/CT (CLIP) IMPLANT
CNTNR URN SCR LID CUP LEK RST (MISCELLANEOUS) ×5 IMPLANT
CONN ST 1/4X3/8  BEN (MISCELLANEOUS) ×3
CONN ST 1/4X3/8 BEN (MISCELLANEOUS) IMPLANT
CONN Y 3/8X3/8X3/8  BEN (MISCELLANEOUS)
CONN Y 3/8X3/8X3/8 BEN (MISCELLANEOUS) IMPLANT
CONT SPEC 4OZ STRL OR WHT (MISCELLANEOUS) ×15
DEFOGGER SCOPE WARMER CLEARIFY (MISCELLANEOUS) ×3 IMPLANT
DERMABOND ADVANCED (GAUZE/BANDAGES/DRESSINGS) ×2
DERMABOND ADVANCED .7 DNX12 (GAUZE/BANDAGES/DRESSINGS) ×1 IMPLANT
DRAIN CHANNEL 28F RND 3/8 FF (WOUND CARE) ×2 IMPLANT
DRAIN CHANNEL 32F RND 10.7 FF (WOUND CARE) IMPLANT
DRAPE ARM DVNC X/XI (DISPOSABLE) ×4 IMPLANT
DRAPE COLUMN DVNC XI (DISPOSABLE) ×1 IMPLANT
DRAPE CV SPLIT W-CLR ANES SCRN (DRAPES) ×3 IMPLANT
DRAPE DA VINCI XI ARM (DISPOSABLE) ×12
DRAPE DA VINCI XI COLUMN (DISPOSABLE) ×3
DRAPE INCISE IOBAN 66X45 STRL (DRAPES) IMPLANT
DRAPE ORTHO SPLIT 77X108 STRL (DRAPES) ×3
DRAPE SURG ORHT 6 SPLT 77X108 (DRAPES) ×1 IMPLANT
ELECT BLADE 6.5 EXT (BLADE) IMPLANT
ELECT REM PT RETURN 9FT ADLT (ELECTROSURGICAL) ×3
ELECTRODE REM PT RTRN 9FT ADLT (ELECTROSURGICAL) ×1 IMPLANT
GAUZE KITTNER 4X5 RF (MISCELLANEOUS) ×12 IMPLANT
GAUZE SPONGE 4X4 12PLY STRL (GAUZE/BANDAGES/DRESSINGS) ×3 IMPLANT
GLOVE BIOGEL PI IND STRL 6.5 (GLOVE) IMPLANT
GLOVE BIOGEL PI IND STRL 7.0 (GLOVE) IMPLANT
GLOVE BIOGEL PI INDICATOR 6.5 (GLOVE) ×6
GLOVE BIOGEL PI INDICATOR 7.0 (GLOVE) ×2
GLOVE SURG SS PI 7.5 STRL IVOR (GLOVE) ×2 IMPLANT
GLOVE SURG SS PI 8.0 STRL IVOR (GLOVE) ×2 IMPLANT
GLOVE SURG SYN 7.5  E (GLOVE)
GLOVE SURG SYN 7.5 E (GLOVE) IMPLANT
GLOVE SURG SYN 7.5 PF PI (GLOVE) IMPLANT
GLOVE TRIUMPH SURG SIZE 7.5 (KITS) IMPLANT
GOWN STRL REUS W/ TWL LRG LVL3 (GOWN DISPOSABLE) ×2 IMPLANT
GOWN STRL REUS W/ TWL XL LVL3 (GOWN DISPOSABLE) ×3 IMPLANT
GOWN STRL REUS W/TWL 2XL LVL3 (GOWN DISPOSABLE) ×3 IMPLANT
GOWN STRL REUS W/TWL LRG LVL3 (GOWN DISPOSABLE) ×6
GOWN STRL REUS W/TWL XL LVL3 (GOWN DISPOSABLE) ×9
HEMOSTAT SURGICEL 2X14 (HEMOSTASIS) ×3 IMPLANT
IRRIGATION STRYKERFLOW (MISCELLANEOUS) ×1 IMPLANT
IRRIGATOR STRYKERFLOW (MISCELLANEOUS) ×3
IV NS 1000ML (IV SOLUTION) ×6
IV NS 1000ML BAXH (IV SOLUTION) IMPLANT
KIT BASIN OR (CUSTOM PROCEDURE TRAY) ×3 IMPLANT
KIT SUCTION CATH 14FR (SUCTIONS) IMPLANT
KIT TURNOVER KIT B (KITS) ×3 IMPLANT
LOOP VESSEL SUPERMAXI WHITE (MISCELLANEOUS) IMPLANT
NDL HYPO 25GX1X1/2 BEV (NEEDLE) ×1 IMPLANT
NDL SPNL 22GX3.5 QUINCKE BK (NEEDLE) ×1 IMPLANT
NEEDLE HYPO 25GX1X1/2 BEV (NEEDLE) ×3 IMPLANT
NEEDLE SPNL 22GX3.5 QUINCKE BK (NEEDLE) ×3 IMPLANT
NS IRRIG 1000ML POUR BTL (IV SOLUTION) ×6 IMPLANT
PACK CHEST (CUSTOM PROCEDURE TRAY) ×3 IMPLANT
PAD ARMBOARD 7.5X6 YLW CONV (MISCELLANEOUS) ×6 IMPLANT
RELOAD STAPLE 45 2.5 WHT DVNC (STAPLE) IMPLANT
RELOAD STAPLE 45 3.5 BLU DVNC (STAPLE) IMPLANT
RELOAD STAPLE 45 4.3 GRN DVNC (STAPLE) IMPLANT
RELOAD STAPLER 2.5X45 WHT DVNC (STAPLE) ×3 IMPLANT
RELOAD STAPLER 3.5X45 BLU DVNC (STAPLE) ×4 IMPLANT
RELOAD STAPLER 4.3X45 GRN DVNC (STAPLE) ×1 IMPLANT
SCISSORS LAP 5X35 DISP (ENDOMECHANICALS) IMPLANT
SEAL CANN UNIV 5-8 DVNC XI (MISCELLANEOUS) ×2 IMPLANT
SEAL XI 5MM-8MM UNIVERSAL (MISCELLANEOUS) ×9
SEALANT PROGEL (MISCELLANEOUS) IMPLANT
SEALANT SURG COSEAL 4ML (VASCULAR PRODUCTS) IMPLANT
SEALANT SURG COSEAL 8ML (VASCULAR PRODUCTS) IMPLANT
SHEARS HARMONIC HDI 20CM (ELECTROSURGICAL) IMPLANT
SOLUTION ELECTROLUBE (MISCELLANEOUS) ×3 IMPLANT
SPECIMEN JAR MEDIUM (MISCELLANEOUS) IMPLANT
SPONGE INTESTINAL PEANUT (DISPOSABLE) IMPLANT
SPONGE TONSIL TAPE 1 RFD (DISPOSABLE) IMPLANT
STAPLER 45 SUREFORM CVD (STAPLE) ×3
STAPLER 45 SUREFORM CVD DVNC (STAPLE) IMPLANT
STAPLER CANNULA SEAL DVNC XI (STAPLE) ×2 IMPLANT
STAPLER CANNULA SEAL XI (STAPLE) ×6
STAPLER RELOAD 2.5X45 WHITE (STAPLE) ×9
STAPLER RELOAD 2.5X45 WHT DVNC (STAPLE) ×3
STAPLER RELOAD 3.5X45 BLU DVNC (STAPLE) ×4
STAPLER RELOAD 3.5X45 BLUE (STAPLE) ×12
STAPLER RELOAD 4.3X45 GREEN (STAPLE) ×3
STAPLER RELOAD 4.3X45 GRN DVNC (STAPLE) ×1
SUT PDS AB 3-0 SH 27 (SUTURE) IMPLANT
SUT PROLENE 4 0 RB 1 (SUTURE)
SUT PROLENE 4-0 RB1 .5 CRCL 36 (SUTURE) IMPLANT
SUT SILK  1 MH (SUTURE) ×3
SUT SILK 1 MH (SUTURE) ×1 IMPLANT
SUT SILK 1 TIES 10X30 (SUTURE) ×3 IMPLANT
SUT SILK 2 0 SH (SUTURE) ×3 IMPLANT
SUT SILK 2 0SH CR/8 30 (SUTURE) ×2 IMPLANT
SUT SILK 3 0 SH 30 (SUTURE) IMPLANT
SUT SILK 3 0SH CR/8 30 (SUTURE) IMPLANT
SUT VIC AB 1 CTX 36 (SUTURE)
SUT VIC AB 1 CTX36XBRD ANBCTR (SUTURE) IMPLANT
SUT VIC AB 2-0 CTX 36 (SUTURE) IMPLANT
SUT VIC AB 3-0 MH 27 (SUTURE) IMPLANT
SUT VIC AB 3-0 X1 27 (SUTURE) ×6 IMPLANT
SUT VICRYL 0 TIES 12 18 (SUTURE) ×3 IMPLANT
SUT VICRYL 0 UR6 27IN ABS (SUTURE) ×6 IMPLANT
SUT VICRYL 2 TP 1 (SUTURE) IMPLANT
SWAB COLLECTION DEVICE MRSA (MISCELLANEOUS) ×2 IMPLANT
SWAB CULTURE ESWAB REG 1ML (MISCELLANEOUS) ×2 IMPLANT
SYR 20ML ECCENTRIC (SYRINGE) IMPLANT
SYR 20ML LL LF (SYRINGE) ×4 IMPLANT
SYR 30ML LL (SYRINGE) ×6 IMPLANT
SYR BULB IRRIG 60ML STRL (SYRINGE) ×2 IMPLANT
SYSTEM RETRIEVAL ANCHOR 12 (MISCELLANEOUS) ×2 IMPLANT
SYSTEM SAHARA CHEST DRAIN ATS (WOUND CARE) ×3 IMPLANT
TAPE CLOTH 4X10 WHT NS (GAUZE/BANDAGES/DRESSINGS) ×3 IMPLANT
TAPE CLOTH SURG 4X10 WHT LF (GAUZE/BANDAGES/DRESSINGS) ×2 IMPLANT
TIP APPLICATOR SPRAY EXTEND 16 (VASCULAR PRODUCTS) IMPLANT
TOWEL GREEN STERILE (TOWEL DISPOSABLE) ×3 IMPLANT
TRAY FOLEY MTR SLVR 16FR STAT (SET/KITS/TRAYS/PACK) ×3 IMPLANT
TROCAR BLADELESS 15MM (ENDOMECHANICALS) IMPLANT
TROCAR XCEL 12X100 BLDLESS (ENDOMECHANICALS) IMPLANT
WATER STERILE IRR 1000ML POUR (IV SOLUTION) ×5 IMPLANT

## 2020-01-29 NOTE — Anesthesia Procedure Notes (Signed)
Procedure Name: Intubation Date/Time: 01/29/2020 7:39 AM Performed by: Valda Favia, CRNA Pre-anesthesia Checklist: Patient identified, Emergency Drugs available, Suction available, Patient being monitored and Timeout performed Patient Re-evaluated:Patient Re-evaluated prior to induction Oxygen Delivery Method: Circle system utilized Preoxygenation: Pre-oxygenation with 100% oxygen Induction Type: IV induction Ventilation: Mask ventilation without difficulty Laryngoscope Size: Mac and 4 Grade View: Grade I Endobronchial tube: Left, Double lumen EBT, EBT position confirmed by auscultation and EBT position confirmed by fiberoptic bronchoscope and 37 Fr Number of attempts: 1 Airway Equipment and Method: Fiberoptic brochoscope Placement Confirmation: ETT inserted through vocal cords under direct vision,  positive ETCO2 and breath sounds checked- equal and bilateral Tube secured with: Tape Dental Injury: Teeth and Oropharynx as per pre-operative assessment

## 2020-01-29 NOTE — Anesthesia Procedure Notes (Signed)
Arterial Line Insertion Start/End11/03/2019 7:46 AM, 01/29/2020 7:49 AM Performed by: [name], MD, anesthesiologist  Patient location: Pre-op. Preanesthetic checklist: patient identified, IV checked, site marked, risks and benefits discussed, surgical consent, monitors and equipment checked, pre-op evaluation, timeout performed and anesthesia consent Lidocaine 1% used for infiltration Right, radial was placed Catheter size: 20 Fr Hand hygiene performed  and maximum sterile barriers used   Attempts: 1 Procedure performed without using ultrasound guided technique. Following insertion, dressing applied and Biopatch. Post procedure assessment: normal and unchanged  Patient tolerated the procedure well with no immediate complications.

## 2020-01-29 NOTE — Interval H&P Note (Signed)
History and Physical Interval Note:  01/29/2020 7:21 AM  Pam Clark  has presented today for surgery, with the diagnosis of RUL BRONCHIECTASIS.  The various methods of treatment have been discussed with the patient and family. After consideration of risks, benefits and other options for treatment, the patient has consented to  Procedure(s): XI ROBOTIC ASSISTED THORASCOPY- RIGHT UPPER LOBECTOMY (Right) as a surgical intervention.  The patient's history has been reviewed, patient examined, no change in status, stable for surgery.  I have reviewed the patient's chart and labs.  Questions were answered to the patient's satisfaction.     [name]

## 2020-01-29 NOTE — Discharge Instructions (Signed)
Robot-Assisted Thoracic Surgery, Care After This sheet gives you information about how to care for yourself after your procedure. Your health care provider may also give you more specific instructions. If you have problems or questions, contact your health care provider. What can I expect after the procedure? After the procedure, it is common to have:  Some pain and aches in the area of your surgical cuts (incisions).  Pain when breathing in (inhaling) and coughing.  Tiredness (fatigue).  Trouble sleeping.  Constipation. Follow these instructions at home: Medicines  Take over-the-counter and prescription medicines only as told by your health care provider.  If you were prescribed an antibiotic medicine, take it as told by your health care provider. Do not stop taking the antibiotic even if you start to feel better.  Talk with your health care provider about safe and effective ways to manage pain after your procedure. Pain management should fit your specific health needs.  Take prescription pain medicine before pain becomes severe. Relieving and controlling your pain will make breathing easier for you. Activity  Return to your normal activities as told by your health care provider. Ask your health care provider what activities are safe for you.  Do not lift anything that is heavier than 10 lb (4.5 kg), or the limit that you are told, until your health care provider says that it is safe.  Avoid sitting for a long time without moving. Get up and move around one or more times every few hours. Bathing  Do not take baths, swim, or use a hot tub until your health care provider approves. You may take showers. Incision care  Follow instructions from your health care provider about how to take care of your incision(s). Make sure you: ? Wash your hands with soap and water before you change your bandage (dressing). If soap and water are not available, use hand sanitizer. ? Change your  dressing as told by your health care provider. ? Leave stitches (sutures), skin glue, or adhesive strips in place. These skin closures may need to stay in place for 2 weeks or longer. If adhesive strip edges start to loosen and curl up, you may trim the loose edges. Do not remove adhesive strips completely unless your health care provider tells you to do that.  Check your incision area every day for signs of infection. Check for: ? Redness, swelling, or pain. ? Fluid or blood. ? Warmth. ? Pus or a bad smell. Driving  Ask your health care provider when it is safe for you to drive.  Do not drive or use heavy machinery while taking prescription pain medicine. Eating and drinking  Follow instructions from your health care provider about eating or drinking restrictions. These will vary depending on what procedure you had. Your health care provider may recommend: ? A liquid diet or soft diet for the first few days. ? Meals that are smaller and more frequent. ? A diet of fruits, vegetables, whole grains, and low-fat proteins. ? Limiting foods that are high in processed sugar and fat, including fried and sweet foods. Pneumonia prevention   Do not use any products that contain nicotine or tobacco, such as cigarettes and e-cigarettes. If you need help quitting, ask your health care provider.  Avoid secondhand smoke.  Do deep breathing exercises and cough regularly as directed. This helps to clear mucus and prevent pneumonia. If it hurts to cough, try one of these methods to ease your pain when you cough: ? Hold a   pillow against your chest. ? Place the palms of both hands over your incisions (use splinting).  Use an incentive spirometer as directed. This device measures how much air your lungs are getting with each breath. Using this will improve your breathing.  Do pulmonary rehabilitation as directed. This is a program that includes exercise, education, and support. General  instructions  Wear compression stockings as told by your health care provider. These stockings help to prevent blood clots and reduce swelling in your legs.  If you have a drainage tube: ? Follow instructions from your health care provider about how to take care of it. ? Do not travel by airplane after your tube is removed until your health care provider tells you it is safe.  To prevent or treat constipation while you are taking prescription pain medicine, your health care provider may recommend that you: ? Drink enough fluid to keep your urine pale yellow. ? Take over-the-counter or prescription medicines. ? Eat foods that are high in fiber, such as fresh fruits and vegetables, whole grains, and beans. ? Limit foods that are high in fat and processed sugars, such as fried and sweet foods.  Keep all follow-up visits as told by your health care provider. This is important. Contact a health care provider if:  You have redness, swelling, or pain around an incision.  You have fluid or blood coming from an incision.  An incision feels warm to the touch.  You have pus or a bad smell coming from an incision.  You have a fever.  You cannot eat or drink without vomiting.  Your prescription pain medicine is not controlling your pain. Get help right away if:  You have chest pain.  Your heart is beating quickly.  You have trouble breathing.  You have trouble speaking.  You are confused.  You feel weak or dizzy, or you faint. These symptoms may represent a serious problem that is an emergency. Do not wait to see if the symptoms will go away. Get medical help right away. Call your local emergency services (911 in the [place] Do not drive yourself to the hospital. Summary  Talk with your health care provider about safe and effective ways to manage pain after your procedure. Pain management should fit your specific health needs.  Return to your normal activities as told by your health  care provider. Ask your health care provider what activities are safe for you.  Do deep breathing exercises and cough regularly as directed. This helps to clear mucus and prevent pneumonia. If it hurts to cough, ease pain by holding a pillow against your chest or by placing the palms of both hands over your incisions (splinting). This information is not intended to replace advice given to you by your health care provider. Make sure you discuss any questions you have with your health care provider. Document Revised: 01/13/2019 Document Reviewed: 07/20/2016 Elsevier Patient Education  2020 [employer]  

## 2020-01-29 NOTE — Discharge Summary (Addendum)
Physician Discharge Summary  Patient ID: Pam Clark MRN: [id number] DOB/AGE: 09-28-1995 24 y.o.  Admit date: 01/29/2020 Discharge date: 02/02/2020  Admission Diagnoses: Right upper lobe bronchiectasis  Discharge Diagnoses:  Right upper lobe bronchiectasis Active Problems:   S/P lobectomy of lung   Discharged Condition: good  History of Present Illness: Pam Clark is a 24 year old woman who was incidentally found to have severe right upper lobe bronchiectasis due to the presence of a partially fluid-filled cystic/cavitary lesion in the right upper lobe.  This appears to be an area of cystic bronchiectasis.  There is an air-fluid level present.  There are multiple adjacent areas of cylindrical and varicose bronchiectasis.  We again discussed that she is at high risk for recurrent infections.  She has not had a severe infection at this point in time.  There also was concern ultimately for hemoptysis, but again she has not had any of that at this point in time.  I again discussed my recommendation for a robotic right upper lobectomy for definitive treatment.  I do not think there is an option for a lesser resection due to the severity of the bronchiectasis centrally in the right upper lobe.  We again discussed the general nature of the procedure and reviewed the indications, risk, benefits, and alternatives as detailed in my previous note.  Hospital Course:  Ms. Schlottman was admitted to the hospital for elective surgery on 01/29/2020.  She was prepared and taken to the operating where right robotic-assisted upper lobectomy was performed along with intercostal nerve block with Exparel.  The procedure was without complication.  At the conclusion, she was extubated and transferred to the postanesthesia care unit.  Her vital signs and respiratory status remained stable.  Follow-up chest x-ray showed an expected right apical space and otherwise clear lung fields.  Following appropriate recovery in  the PACU, she was transferred to progressive care.  Pain was managed with IV Toradol, Tylenol, and oxycodone.  She was mobilized on the first postoperative day and quickly regained independence. There was a small air leak noted from the pleural tube on the first post-op day and a stable right apical space on CXR so the tube was placed to water seal.  By the following day, she had developed a moderate right PX so the tube was placed back to suction. The lung re-expanded and after remaining stable on water seal for another day, the tube was removed. Follow up CXR showed stable right apical pneumothorax. The path report on the resected lung was consistent with benign bronchiectasis.  The patients nausea resolved.  She was ambulating the hallways independently.  She is medically stable for discharge home today.  Consults: None  Significant Diagnostic Studies:    Diagnostic Tests: CT CHEST WITHOUT CONTRAST  TECHNIQUE: Multidetector CT imaging of the chest was performed following the standard protocol without IV contrast.  COMPARISON: CTA chest 04/16/2019  FINDINGS: Cardiovascular: The heart size is normal. No substantial pericardial effusion. No thoracic aortic aneurysm.  Mediastinum/Nodes: No mediastinal lymphadenopathy. No evidence for gross hilar lymphadenopathy although assessment is limited by the lack of intravenous contrast on today's study. The esophagus has normal imaging features. There is no axillary lymphadenopathy.  Lungs/Pleura: As on prior study, thin walled cavitary lesion identified central right upper lobe. This measures 5.0 x 3.3 cm today compared to 5.6 x 3.4 cm previously. Air-fluid level noted with debris in the fluid component. This could potentially represent cystic bronchiectasis. There is a small bronchiectatic component along the anteromedial margin  of the dominant lesion containing a small ball like focus of soft tissue density (see image 62 of series 3).  Bronchiectatic change with peripheral small airway impaction and tree-in-bud ground-glass nodularity noted right middle lobe, persistent but decreased in the interval.  No new suspicious nodule or mass. No focal airspace consolidation. No pleural effusion.  Upper Abdomen: Unremarkable.  Musculoskeletal: No worrisome lytic or sclerotic osseous abnormality.  IMPRESSION: 1. No substantial interval change in exam. Stable appearance of the thin walled cystic lesion in the parahilar central right lung containing fluid level with debris visible in the fluid. 2. Adjacent areas of apparent cylindrical and varicose bronchiectasis extending into the right middle lobe are also similar with peripheral small airway impaction and persistent but decreased peripheral tree-in-bud opacity. 3. Small thin walled cystic focus medial to the cranial aspect of the dominant thin walled cyst contains a central ball like focus of soft tissue density. Atypical/fungal infection could have this appearance.   Electronically Signed By: Kennith Center M.D. On: 12/18/2019 15:45   Treatments:   OPERATIVE REPORT  DATE OF PROCEDURE:  01/29/2020  PREOPERATIVE DIAGNOSIS:  Right upper lobe bronchiectasis.  POSTOPERATIVE DIAGNOSIS:  Right upper lobe bronchiectasis.  PROCEDURE:  Xi robotic-assisted thoracoscopy, right upper lobectomy, intercostal nerve blocks at levels 3 through 10.  SURGEON:  Charlett Lango, MD  ASSISTANT:  Jillyn Hidden.  ANESTHESIA:  General.  FINDINGS:  Purulent contents of the cystic structure in right upper lobe.  CLINICAL NOTE:  The patient is a 24 year old woman with a history of bronchitis and a cystic cavitary lesion in the right lung.  She has been treated with antibiotics.  A chest x-ray showed a cystic lesion in the right lung and a CT of the chest showed  severe bronchiectasis with partially fluid filled cystic/cavitary lesion.  She was advised to  undergo surgical resection because of the risk of recurrent infections and hemoptysis.  The indications, risks, benefits, and alternatives were discussed in  detail with the patient.  She understood and accepted the risks and agreed to proceed.   SURGICAL PATHOLOGY Surgical pathology Collected: 01/29/20 1051  Lab status: Final-Edited  Resulting lab: [facility]  Value: SURGICAL PATHOLOGY  CASE: [id number]  PATIENT: [facility]  Surgical Pathology Report      Clinical History: RUL bronchiectasis (cm)      FINAL MICROSCOPIC DIAGNOSIS:   A. LUNG, RIGHT UPPER LOBE, LOBECTOMY:  - Inflammation with inspissated mucus and dilated airways.  - Two benign reactive lymph nodes.  - No granulomas or malignancy.  - See comment.   COMMENT:  There are dilated bronchi and bronchioles with inspissated mucus and the  adjacent parenchyma shows chronic and focally acute inflammation with  numerous foamy histiocytes. The findings are consistent with the  clinical impression of bronchiectasis. No granulomas are identified and  there is no evidence of malignancy.    GROSS DESCRIPTION:  Specimen: Lung, right upper lobectomy, received fresh  Specimen integrity: Intact  Size, weight: 163 g, 15 x 8 x 3.4 cm  Pleura: Pink-purple, diffusely roughened over apical posterior segment  and superior half of anterior segment  Lesion: On sectioning, the superior half of the specimen has dilated  areas of the bronchial tree which are up to 3 cm in greatest dimension,  and are filled with pale yellow thick cloudy material, possibly pus.  Parenchyma surrounding these dilated areas is yellow-pink to red,  indurated and vaguely nodular. A mass lesion is not identified.  Margin(s): No gross lesions  or masses.  Hilar vessels: Unremarkable  Nonneoplastic parenchyma: Parenchyma in the lower half of the specimen  is pink-red, spongy.  Lymph nodes: At the hilum is a possible 1.4 cm pink-gray  soft lymph  node.  Block Summary:  Blocks 1-3 = dilated bronchial spaces, adjacent nodular parenchyma and  roughened pleura  Block 4 = section of parenchyma, inferior half  Block 5 = bronchial and vascular margins  Block 6 = possible lymph node at hilum, bisected  SW 01/30/2020   Final Diagnosis performed by Jimmy Picket, MD.  Electronically signed  01/31/2020  Technical and / or Professional components performed at [facility]. [facility], [address], [place], [place] [address].  Immunohistochemistry Technical component (if applicable) was performed  at [facility]. [address], [address],  [place], [place] [address].  IMMUNOHISTOCHEMISTRY DISCLAIMER (if applicable):  Some of these immunohistochemical stains may have been developed and the  performance characteristics determine by [facility]. Some  may not have been cleared or approved by the U.S. Food and Drug  Administration. The FDA has determined that such clearance or approval  is not necessary. This test is used for clinical purposes. It should not  be regarded as investigational or for research. This laboratory is  certified under the Clinical Laboratory Improvement Amendments of 1988  (CLIA-88) as qualified to perform high complexity clinical laboratory  testing. The controls stained appropriately.      Discharge Exam: Blood pressure 103/75, pulse 76, temperature 98.2 F (36.8 C), temperature source Oral, resp. rate 16, height 5\' 6"  (1.676 m), weight 66.2 kg, last menstrual period 01/11/2020, SpO2 96 %.  General appearance: alert, cooperative and no distress Neurologic: intact Heart: regular rate and rhythm Lungs: Breath sounds clear, no air leak.  CT is on water seal.  CXR shows better expansion of the remaining right lung, expected apical space.  Wound: Port incisions are dry.   Disposition: Discharge disposition: 01-Home or Self Care        Allergies as of  02/02/2020   No Known Allergies     Medication List    TAKE these medications   oxyCODONE 5 MG immediate release tablet Commonly known as: Oxy IR/ROXICODONE Take 1 tablet (5 mg total) by mouth every 4 (four) hours as needed for up to 5 days for moderate pain.       Follow-up Information    13/07/2019, MD. Go on 02/27/2020.   Specialty: Cardiothoracic Surgery Why: Your appointment is on Tuesday, 02/27/20 at 10AM. Please arrive 30 minutes early for a chest x-ray to be perfomed by [facility] located on the first floor of the same building.  Contact information: [address] [place] [phone]               Signed:  [phone] PA-C  Update by: Jillyn Hidden, PA-C 02/02/2020, 1:41 PM

## 2020-01-29 NOTE — Plan of Care (Signed)
  Problem: Education: Goal: Knowledge of General Education information will improve Description: Including pain rating scale, medication(s)/side effects and non-pharmacologic comfort measures Outcome: Progressing   Problem: Clinical Measurements: Goal: Diagnostic test results will improve Outcome: Progressing   Problem: Clinical Measurements: Goal: Respiratory complications will improve Outcome: Progressing   Problem: Nutrition: Goal: Adequate nutrition will be maintained Outcome: Progressing   Problem: Coping: Goal: Level of anxiety will decrease Outcome: Progressing   Problem: Pain Managment: Goal: General experience of comfort will improve Outcome: Progressing   Problem: Skin Integrity: Goal: Risk for impaired skin integrity will decrease Outcome: Progressing

## 2020-01-29 NOTE — Transfer of Care (Signed)
Immediate Anesthesia Transfer of Care Note  Patient: Pam Clark  Procedure(s) Performed: XI ROBOTIC ASSISTED THORASCOPY- RIGHT UPPER LOBECTOMY (Right Chest) INTERCOSTAL NERVE BLOCK (Right Chest)  Patient Location: PACU  Anesthesia Type:General  Level of Consciousness: drowsy  Airway & Oxygen Therapy: Patient Spontanous Breathing and Patient connected to face mask oxygen  Post-op Assessment: Report given to RN and Post -op Vital signs reviewed and stable  Post vital signs: Reviewed and stable  Last Vitals:  Vitals Value Taken Time  BP 117/86 01/29/20 1144  Temp    Pulse 73 01/29/20 1150  Resp 16 01/29/20 1151  SpO2 100 % 01/29/20 1150  Vitals shown include unvalidated device data.  Last Pain:  Vitals:   01/29/20 0609  TempSrc: Oral  PainSc: 0-No pain         Complications: No complications documented.

## 2020-01-29 NOTE — Brief Op Note (Addendum)
01/29/2020  11:22 AM  PATIENT:  Pam Clark  24 y.o. female  PRE-OPERATIVE DIAGNOSIS:  RUL BRONCHIECTASIS  POST-OPERATIVE DIAGNOSIS:  RUL BRONCHIECTASIS  PROCEDURE:  Procedure(s): XI ROBOTIC ASSISTED THORASCOPY- RIGHT UPPER LOBECTOMY (Right) INTERCOSTAL NERVE BLOCK (Right)  SURGEON:  Surgeon(s) and Role:    * [name], MD - Primary  PHYSICIAN ASSISTANT: Jillyn Hidden, PA-C  ANESTHESIA:   general  EBL:  <34ml  BLOOD ADMINISTERED:none  DRAINS: Right pleural 48fr Blake drain    LOCAL MEDICATIONS USED:  Right intercostal Exparel  SPECIMEN:  Source of Specimen:  right upper lung lobe  DISPOSITION OF SPECIMEN:  PATHOLOGY  COUNTS:  YES  DICTATION: .-  PLAN OF CARE: Admit to inpatient   PATIENT DISPOSITION:  PACU - hemodynamically stable.   Delay start of Pharmacological VTE agent (>24hrs) due to surgical blood loss or risk of bleeding: no

## 2020-01-29 NOTE — Op Note (Signed)
Pam Clark, Pam Clark MEDICAL RECORD CZ:[id number] ACCOUNT [id number] DATE OF BIRTH:31-Aug-1995 FACILITY: [facility] LOCATION: [facility] PHYSICIAN:Tawnie Ehresman Lars Pinks, MD  OPERATIVE REPORT  DATE OF PROCEDURE:  01/29/2020  PREOPERATIVE DIAGNOSIS:  Right upper lobe bronchiectasis.  POSTOPERATIVE DIAGNOSIS:  Right upper lobe bronchiectasis.  PROCEDURE:   Xi robotic-assisted thoracoscopy, Right upper lobectomy, Intercostal nerve blocks levels 3 through 10.  SURGEON:  Charlett Lango, MD  ASSISTANT:  Jillyn Hidden, PA-C.  ANESTHESIA:  General.  FINDINGS:  Purulent contents of the cystic structure in right upper lobe.  CLINICAL NOTE:  Pam Clark is a 24 year old woman with a history of bronchitis and a cystic cavitary lesion in the right lung.  She has been treated with antibiotics.  A chest x-ray showed a cystic lesion in the right lung and a CT of the chest showed  severe bronchiectasis with partially fluid filled cystic/cavitary lesion.  She was advised to undergo surgical resection because of the risk of recurrent infections and hemoptysis.  The indications, risks, benefits, and alternatives were discussed in  detail with the patient.  She understood and accepted the risks and agreed to proceed.  OPERATIVE NOTE:  Pam Clark was brought to the preoperative holding area on 01/29/2020.  Anesthesia placed a central venous catheter and an arterial blood pressure monitoring line.  She was taken to the Operating Room, anesthetized and intubated.  A  Foley catheter was placed.  Intravenous antibiotics were administered.  Sequential compression devices were placed on the calves for DVT prophylaxis.  She was placed in the left lateral decubitus position and the right chest was prepped and draped in the  usual sterile fashion.  Single lung ventilation of the left lung was initiated and was tolerated well throughout the procedure.  A timeout was performed.  A solution containing 20 mL of liposomal  bupivacaine and 30 mL of 0.5% bupivacaine and 50 mL of saline was prepared.  This was used for local at the incision sites as well as for the intercostal nerve blocks.  An incision was  made in the eighth interspace in the mid axillary line and an 8 mm port was inserted.  The thoracoscope was advanced into the chest.  There was good isolation of the right lung.  Carbon dioxide was insufflated via the port until the AirSeal port was then  placed.  A 12 mm port was placed anteriorly in the 7th interspace and a 12 mm AirSeal port was placed in the 10th interspace centered between the 2 anterior ports.  The carbon dioxide then was insufflated through the AirSeal port per protocol.   Intercostal nerve blocks were performed by injecting 10 mL of the bupivacaine solution into a subpleural plane at each level from the 3rd to the 10th interspace using a needle inserted from a posterior approach.  Two additional 8 mm ports were placed in  the 8th interspace posteriorly placed 5 cm apart.  The robot was deployed and the camera port was docked.  The camera was inserted.  Targeting was performed.  The remaining ports were docked and instruments were inserted with thoracoscopic visualization.  There were some thin adhesions of the upper lobe to the anterior chest wall and mediastinum.  These were taken down with bipolar cautery.  The upper lobe was relatively oversized and the middle lobe was relatively small.  The fissures were almost  entirely complete.  The inferior ligament was divided with bipolar cautery.  The dissection then was carried to the area of the level 11 nodes at  the bifurcation of the upper lobe bronchus and bronchus intermedius. In this area was noted that there were  markedly enlarged bronchial arteries.  These arterial vessels were doubly clipped on the patient's side and then divided with bipolar cautery on the patient's side.  Dissection then was carried superiorly dividing the pleural reflection at  the hilum.   Next, the fissure was completed almost entirely with bipolar cautery although there was one area where the fissure was not entirely clear connecting the superior segment to the upper lobe and this was divided with the robotic stapler using a blue  cartridge.  The minor fissure was also essentially complete and was opened with bipolar cautery.  It should be noted that access was relatively difficult due to the relatively small size of the patient's chest and difficulty retracting the upper lobe.   The upper lobe was retracted posteriorly and the right upper lobe pulmonary vein branches were identified.  These were dissected out.  It was a slow and tedious process, but ultimately the superior pulmonary vein was encircled and then divided using the  robotic stapler with a vascular cartridge.  Dissection continued to proceed slowly.  The upper lobe arterial vessels all arose as a common trunk.  This trunk was encircled and divided with the robotic stapler using a vascular cartridge as well.  There  were some relatively enlarged, but otherwise benign appearing lymph nodes.  A formal lymph node dissection was not performed.  Nodes along the upper lobe bronchus were taken with the specimen.  With the upper lobe bronchus now dissected out, a green cartridge was  used on the robotic stapler. It was placed across the base of the upper lobe bronchus and closed.  A test inflation showed aeration of the lower and middle lobes.  Stapler was fired, transecting the bronchus.  The middle lobe had been tacked to the lower  lobe using a stapler with a blue cartridge earlier in the procedure to prevent middle lobe torsion.  During the dissection of the upper lobe there was perforation of the cystic structure and some purulent fluid was noted.  There was no gross spillage.  A  12 mm endoscopic retrieval bag was inserted and the upper lobe was placed into the bag, which was then brought down to the inferior part of  the chest.  The robot was undocked.  The anterior 7th interspace incision was lengthened slightly to approximately 3 cm in length.   The specimen was removed in the retrieval bag through this incision.  On the back table the bag was opened and aerobic and anaerobic cultures were obtained from the cyst and then the lobe was sent for permanent pathology.  It should be noted that all  sponges and the vessel loop that were used during the procedure were removed prior to undocking the robot.  The chest was copiously irrigated with approximately 2 liters of warm saline.  A test inflation at 30 cm of water revealed no leakage from the  bronchial stump.  A 28-French chest tube was placed through the original 8th interspace incision and directed to the apex.  It was secured at the skin with a 0 silk suture.  Final inspection was made for hemostasis.  After confirming the counts were  correct the dual-lung ventilation was resumed.  The camera and port were removed.  The incisions were closed with 0 Vicryl fascial sutures followed by 3-0 Vicryl subcuticular sutures.  Dermabond was applied.  The chest tube was  placed to a Pleur-evac on  waterseal.  The patient then was extubated in the operating room and taken to the Postanesthetic Care Unit in good condition.  HN/NUANCE  D:01/29/2020 T:01/29/2020 JOB:[id number]

## 2020-01-29 NOTE — Anesthesia Postprocedure Evaluation (Signed)
Anesthesia Post Note  Patient: Pam Clark  Procedure(s) Performed: XI ROBOTIC ASSISTED THORASCOPY- RIGHT UPPER LOBECTOMY (Right Chest) INTERCOSTAL NERVE BLOCK (Right Chest)     Patient location during evaluation: PACU Anesthesia Type: General Level of consciousness: awake and alert Pain management: pain level controlled Vital Signs Assessment: post-procedure vital signs reviewed and stable Respiratory status: spontaneous breathing, nonlabored ventilation, respiratory function stable and patient connected to nasal cannula oxygen Cardiovascular status: blood pressure returned to baseline and stable Postop Assessment: no apparent nausea or vomiting Anesthetic complications: no   No complications documented.  Last Vitals:  Vitals:   01/29/20 1500 01/29/20 1600  BP: 108/77 95/66  Pulse: 76 91  Resp: 16 19  Temp: 36.8 C 37.1 C  SpO2: 100% 98%    Last Pain:  Vitals:   01/29/20 1600  TempSrc: Oral  PainSc:                  [name]

## 2020-01-30 ENCOUNTER — Encounter (HOSPITAL_COMMUNITY): Payer: Self-pay | Admitting: Thoracic Surgery (Cardiothoracic Vascular Surgery)

## 2020-01-30 ENCOUNTER — Inpatient Hospital Stay (HOSPITAL_COMMUNITY): Payer: BC Managed Care – PPO

## 2020-01-30 LAB — BASIC METABOLIC PANEL
Anion gap: 9 (ref 5–15)
BUN: 10 mg/dL (ref 6–20)
CO2: 22 mmol/L (ref 22–32)
Calcium: 8.7 mg/dL — ABNORMAL LOW (ref 8.9–10.3)
Chloride: 105 mmol/L (ref 98–111)
Creatinine, Ser: 0.76 mg/dL (ref 0.44–1.00)
GFR, Estimated: >60 mL/min (ref >60)
Glucose, Bld: 132 mg/dL — ABNORMAL HIGH (ref 70–99)
Potassium: 3.8 mmol/L (ref 3.5–5.1)
Sodium: 136 mmol/L (ref 135–145)

## 2020-01-30 LAB — CBC
HCT: 30.3 % — ABNORMAL LOW (ref 36.0–46.0)
Hemoglobin: 10.3 g/dL — ABNORMAL LOW (ref 12.0–15.0)
MCH: 31.5 pg (ref 26.0–34.0)
MCHC: 34 g/dL (ref 30.0–36.0)
MCV: 92.7 fL (ref 80.0–100.0)
Platelets: 194 10*3/uL (ref 150–400)
RBC: 3.27 MIL/uL — ABNORMAL LOW (ref 3.87–5.11)
RDW: 12.1 % (ref 11.5–15.5)
WBC: 10.9 10*3/uL — ABNORMAL HIGH (ref 4.0–10.5)
nRBC: 0 % (ref 0.0–0.2)

## 2020-01-30 MED ORDER — KETOROLAC TROMETHAMINE 15 MG/ML IJ SOLN
30.0000 mg | Freq: Four times a day (QID) | INTRAMUSCULAR | Status: AC
  Administered 2020-01-30 – 2020-02-01 (×8): 30 mg via INTRAVENOUS
  Filled 2020-01-30 (×7): qty 2

## 2020-01-30 MED ORDER — ALBUTEROL SULFATE (2.5 MG/3ML) 0.083% IN NEBU: 2.5000 mg | INHALATION_SOLUTION | Freq: Four times a day (QID) | RESPIRATORY_TRACT | Status: DC | PRN

## 2020-01-30 MED ORDER — GUAIFENESIN ER 600 MG PO TB12
600.0000 mg | ORAL_TABLET | Freq: Two times a day (BID) | ORAL | Status: DC
  Administered 2020-01-30 – 2020-02-02 (×7): 600 mg via ORAL
  Filled 2020-01-30 (×7): qty 1

## 2020-01-30 NOTE — Progress Notes (Addendum)
      [address]       [place] [address]             [phone]        1 Day Post-Op Procedure(s) (LRB): XI ROBOTIC ASSISTED THORASCOPY- RIGHT UPPER LOBECTOMY (Right) INTERCOSTAL NERVE BLOCK (Right) Subjective: Sitting up in bed, says pain control is "pretty good".  She has walked in the hall this morning. Tolerating PO's and currently on RA.   Objective: Vital signs in last 24 hours: Temp:  [97 F (36.1 C)-99.5 F (37.5 C)] 98.4 F (36.9 C) (11/02 0736) Pulse Rate:  [67-95] 67 (11/02 0736) Cardiac Rhythm: Normal sinus rhythm (11/02 0331) Resp:  [14-20] 19 (11/02 0736) BP: (93-116)/(56-81) 93/64 (11/02 0736) SpO2:  [93 %-100 %] 93 % (11/02 0736) Arterial Line BP: (113-122)/(57-71) 122/71 (11/01 1400)    Intake/Output from previous day: 11/01 0701 - 11/02 0700 In: 2908 [P.O.:240; I.V.:2468; IV Piggyback:200] Out: 3311 [Urine:3025; Blood:50; Chest Tube:236] Intake/Output this shift: No intake/output data recorded.  General appearance: alert, cooperative and no distress Neurologic: intact Heart: regular rate and rhythm Lungs: clear to auscultation bilaterally and Small air leak. CT is on suction.  Wound: Port incisions are dry.   Lab Results: Recent Labs    01/30/20 0231  WBC 10.9*  HGB 10.3*  HCT 30.3*  PLT 194   BMET:  Recent Labs    01/30/20 0231  NA 136  K 3.8  CL 105  CO2 22  GLUCOSE 132*  BUN 10  CREATININE 0.76  CALCIUM 8.7*    PT/INR: No results for input(s): LABPROT, INR in the last 72 hours. ABG    Component Value Date/Time   PHART 7.425 01/25/2020 1009   HCO3 22.3 01/25/2020 1009   TCO2 25 04/16/2019 0938   ACIDBASEDEF 1.5 01/25/2020 1009   O2SAT 98.6 01/25/2020 1009   CBG (last 3)  No results for input(s): GLUCAP in the last 72 hours.  Assessment/Plan: S/P Procedure(s) (LRB): XI ROBOTIC ASSISTED THORASCOPY- RIGHT UPPER LOBECTOMY (Right) INTERCOSTAL NERVE BLOCK (Right)  -POD-1 robotic-assisted right upper  lobectomy for symptomatic bronchiectasis.  Good oxygenation on RA. Small air leak and minimal CT drainage. CXR shows small, stable and expected right apical space.  Expect we can go to water seal today. D/C IVF. Continue to mobilize and encourage IS.    LOS: 1 day    Leary Roca, PA-C  01/30/2020 Patient seen and examined, agree with above Looks great Dc A line Change albuterol to PRN Minimal air leak SCD + enoxaparin  Salvatore Decent. Dorris Fetch, MD [facility] [phone]

## 2020-01-30 NOTE — Plan of Care (Signed)
  Problem: Education: Goal: Knowledge of General Education information will improve Description: Including pain rating scale, medication(s)/side effects and non-pharmacologic comfort measures Outcome: Progressing   Problem: Health Behavior/Discharge Planning: Goal: Ability to manage health-related needs will improve Outcome: Progressing   Problem: Clinical Measurements: Goal: Ability to maintain clinical measurements within normal limits will improve Outcome: Progressing   Problem: Clinical Measurements: Goal: Diagnostic test results will improve Outcome: Progressing   Problem: Clinical Measurements: Goal: Respiratory complications will improve Outcome: Progressing   Problem: Activity: Goal: Risk for activity intolerance will decrease Outcome: Progressing   Problem: Nutrition: Goal: Adequate nutrition will be maintained Outcome: Progressing   Problem: Coping: Goal: Level of anxiety will decrease Outcome: Progressing   Problem: Pain Managment: Goal: General experience of comfort will improve Outcome: Progressing   Problem: Skin Integrity: Goal: Risk for impaired skin integrity will decrease Outcome: Progressing

## 2020-01-30 NOTE — Progress Notes (Signed)
Foley catheter removed per patient request. Will continue to monitor.

## 2020-01-31 ENCOUNTER — Inpatient Hospital Stay (HOSPITAL_COMMUNITY): Payer: BC Managed Care – PPO

## 2020-01-31 LAB — SURGICAL PATHOLOGY

## 2020-01-31 NOTE — Progress Notes (Addendum)
      [address]       [place] [address]             [phone]        1 Day Post-Op Procedure(s) (LRB): XI ROBOTIC ASSISTED THORASCOPY- RIGHT UPPER LOBECTOMY (Right) INTERCOSTAL NERVE BLOCK (Right) Subjective: Resting in bed. Says she is "doing better".   CT has been on water seal ~24 hours.   Objective: Vital signs in last 24 hours: Temp:  [97.8 F (36.6 C)-98.5 F (36.9 C)] 98.3 F (36.8 C) (11/03 0344) Pulse Rate:  [60-78] 68 (11/03 0344) Cardiac Rhythm: Normal sinus rhythm (11/03 0344) Resp:  [17-20] 20 (11/03 0344) BP: (91-103)/(54-85) 94/60 (11/03 0344) SpO2:  [97 %-100 %] 99 % (11/03 0344)    Intake/Output from previous day: 11/02 0701 - 11/03 0700 In: 1200 [P.O.:1200] Out: 80 [Chest Tube:80] Intake/Output this shift: No intake/output data recorded.  General appearance: alert, cooperative and no distress Neurologic: intact Heart: regular rate and rhythm Lungs: clear to auscultation bilaterally and small air leak. CT is on water seal. CXR showed a larger PTX this morning so the tube was placed to suction with a large volume of air evacuated initially then minimal air leak.  Wound: Port incisions are dry.   Lab Results: Recent Labs    01/30/20 0231  WBC 10.9*  HGB 10.3*  HCT 30.3*  PLT 194   BMET:  Recent Labs    01/30/20 0231  NA 136  K 3.8  CL 105  CO2 22  GLUCOSE 132*  BUN 10  CREATININE 0.76  CALCIUM 8.7*    PT/INR: No results for input(s): LABPROT, INR in the last 72 hours. ABG    Component Value Date/Time   PHART 7.425 01/25/2020 1009   HCO3 22.3 01/25/2020 1009   TCO2 25 04/16/2019 0938   ACIDBASEDEF 1.5 01/25/2020 1009   O2SAT 98.6 01/25/2020 1009   CBG (last 3)  No results for input(s): GLUCAP in the last 72 hours.  Assessment/Plan: S/P Procedure(s) (LRB): XI ROBOTIC ASSISTED THORASCOPY- RIGHT UPPER LOBECTOMY (Right) INTERCOSTAL NERVE BLOCK (Right)  -POD-2 robotic-assisted right upper lobectomy for  symptomatic bronchiectasis.  Good oxygenation on RA. CT on water seal about 24 hours with development of a moderate right PTX. CT placed back to suction, Pt had some pain as the right lung re-expanded and air was evacuated.   -Con't working on pulmonary hygiene, ambulation. On Lovenox DVT PPX.    LOS: 2 days    Leary Roca, PA-C Patient seen and examined, agree with above Lung dropped on water seal, now back on suction with small leak Poor cough, only ~250 ml with IS= continue to work on pulmonary hygiene  Viviann Spare C. Dorris Fetch, MD [facility] [phone]

## 2020-01-31 NOTE — Plan of Care (Signed)

## 2020-02-01 ENCOUNTER — Inpatient Hospital Stay (HOSPITAL_COMMUNITY): Payer: BC Managed Care – PPO

## 2020-02-01 NOTE — Progress Notes (Addendum)
      [address]       [place] [address]             [phone]        1 Day Post-Op Procedure(s) (LRB): XI ROBOTIC ASSISTED THORASCOPY- RIGHT UPPER LOBECTOMY (Right) INTERCOSTAL NERVE BLOCK (Right) Subjective: Much more comfortable today. Walked several times yesterday and again this morning. Doing better with IS.  CT on suction.   Objective: Vital signs in last 24 hours: Temp:  [98 F (36.7 C)-98.8 F (37.1 C)] 98.8 F (37.1 C) (11/04 0332) Pulse Rate:  [60-75] 61 (11/04 0332) Cardiac Rhythm: Normal sinus rhythm;Sinus bradycardia (11/04 0423) Resp:  [15-22] 15 (11/04 0332) BP: (92-103)/(46-67) 93/57 (11/04 0332) SpO2:  [98 %-100 %] 100 % (11/04 0332)    Intake/Output from previous day: 11/03 0701 - 11/04 0700 In: -  Out: 70 [Chest Tube:70] Intake/Output this shift: No intake/output data recorded.  General appearance: alert, cooperative and no distress Neurologic: intact Heart: regular rate and rhythm Lungs: Breat sounds clear, I did not see an air leak.  CT is on suction. CXR shows better expansion of the remaining right lung, expected apical space.  Wound: Port incisions are dry.   Lab Results: Recent Labs    01/30/20 0231  WBC 10.9*  HGB 10.3*  HCT 30.3*  PLT 194   BMET:  Recent Labs    01/30/20 0231  NA 136  K 3.8  CL 105  CO2 22  GLUCOSE 132*  BUN 10  CREATININE 0.76  CALCIUM 8.7*    PT/INR: No results for input(s): LABPROT, INR in the last 72 hours. ABG    Component Value Date/Time   PHART 7.425 01/25/2020 1009   HCO3 22.3 01/25/2020 1009   TCO2 25 04/16/2019 0938   ACIDBASEDEF 1.5 01/25/2020 1009   O2SAT 98.6 01/25/2020 1009   CBG (last 3)  No results for input(s): GLUCAP in the last 72 hours.  Assessment/Plan: S/P Procedure(s) (LRB): XI ROBOTIC ASSISTED THORASCOPY- RIGHT UPPER LOBECTOMY (Right) INTERCOSTAL NERVE BLOCK (Right)  -POD-3 robotic-assisted right upper lobectomy for symptomatic bronchiectasis.   Good oxygenation on RA. CT back on ssuction past 24 hours after developing a PTX on water seal. Will leave on suction for now.   -Con't working on pulmonary hygiene, ambulation. On Lovenox DVT PPX.    LOS: 3 days   Patient seen and examined. Feels better No air leak at present. CXR shows decreased pneumothorax, but not completely reexpanded. Will try water seal again this morning and repeat a CXR later today Cultures NGTD, path benign  Viviann Spare C. Dorris Fetch, MD [facility] [phone]

## 2020-02-02 ENCOUNTER — Inpatient Hospital Stay (HOSPITAL_COMMUNITY): Payer: BC Managed Care – PPO

## 2020-02-02 MED ORDER — ONDANSETRON HCL 4 MG PO TABS: 4.0000 mg | ORAL_TABLET | Freq: Three times a day (TID) | ORAL | 0 refills | Status: DC | PRN

## 2020-02-02 MED ORDER — OXYCODONE HCL 5 MG PO TABS: 5.0000 mg | ORAL_TABLET | ORAL | 0 refills | Status: AC | PRN

## 2020-02-02 NOTE — Progress Notes (Signed)
R lateral chest tube removal with NO complications by this RN. Suture tied, covered with gauze and medipore tape. Bedrest until 0945, instructed to continue IS and ambulate later today. Same day cxr ordered for noon. Pt resting comfortably on room air, SpO2 97%, BP 114/79 (87). Bedside RN Tamera Punt) will continue to monitor.

## 2020-02-02 NOTE — Progress Notes (Addendum)
      [address]       [place] [address]             [phone]        4 Day Post-Op Procedure(s) (LRB): XI ROBOTIC ASSISTED THORASCOPY- RIGHT UPPER LOBECTOMY (Right) INTERCOSTAL NERVE BLOCK (Right) Subjective: Remains reasonably comfortable, no new concerns. On RA. CT on water seal.  Objective: Vital signs in last 24 hours: Temp:  [97.9 F (36.6 C)-98.1 F (36.7 C)] 98.1 F (36.7 C) (11/05 0356) Pulse Rate:  [64-79] 76 (11/05 0356) Cardiac Rhythm: Normal sinus rhythm (11/05 0356) Resp:  [7-20] 16 (11/05 0356) BP: (91-104)/(54-75) 103/75 (11/05 0356) SpO2:  [96 %-100 %] 96 % (11/05 0356)    Intake/Output from previous day: 11/04 0701 - 11/05 0700 In: 720 [P.O.:720] Out: 50 [Chest Tube:50] Intake/Output this shift: No intake/output data recorded.  General appearance: alert, cooperative and no distress Neurologic: intact Heart: regular rate and rhythm Lungs: Breath sounds clear, no air leak.  CT is on water seal.  CXR shows better expansion of the remaining right lung, expected apical space.  Wound: Port incisions are dry.   Lab Results: No results for input(s): WBC, HGB, HCT, PLT in the last 72 hours. BMET:  No results for input(s): NA, K, CL, CO2, GLUCOSE, BUN, CREATININE, CALCIUM in the last 72 hours.  PT/INR: No results for input(s): LABPROT, INR in the last 72 hours. ABG    Component Value Date/Time   PHART 7.425 01/25/2020 1009   HCO3 22.3 01/25/2020 1009   TCO2 25 04/16/2019 0938   ACIDBASEDEF 1.5 01/25/2020 1009   O2SAT 98.6 01/25/2020 1009   CBG (last 3)  No results for input(s): GLUCAP in the last 72 hours.  Assessment/Plan: S/P Procedure(s) (LRB): XI ROBOTIC ASSISTED THORASCOPY- RIGHT UPPER LOBECTOMY (Right) INTERCOSTAL NERVE BLOCK (Right)  -POD-4 robotic-assisted right upper lobectomy for symptomatic bronchiectasis.  Good oxygenation on RA. CT on water seal past 24 hours, further expansion of the right lung since  yesterday. Possibly d/c tube today if Dr. Dorris Fetch agrees.  -Con't working on pulmonary hygiene, ambulation. On Lovenox DVT PPX.    LOS: 4 days    M. Lewayne Bunting [phone]  Patient seen and examined, agree with above There is tidal variation but no leak with cough. CXR shows further decrease in size of space/ pneumothorax. Will dc chest tube Repeat CXR around noon Path- benign bronchiectasis  Salvatore Decent. Dorris Fetch, MD [facility] [phone]

## 2020-02-02 NOTE — Plan of Care (Signed)

## 2020-02-02 NOTE — Progress Notes (Signed)
Discharge instructions, rxs and follow up appts explained and provided to patient. Patient verbalized understanding. Patient left floor via wheelchair accompanied by staff.   Demitris Pokorny, Kae Heller, RN

## 2020-02-03 LAB — AEROBIC/ANAEROBIC CULTURE W GRAM STAIN (SURGICAL/DEEP WOUND): Gram Stain: NONE SEEN

## 2020-02-04 LAB — TYPE AND SCREEN
ABO/RH(D): B POS
Antibody Screen: POSITIVE
Unit division: 0
Unit division: 0

## 2020-02-04 LAB — BPAM RBC
Blood Product Expiration Date: 202111192359
Blood Product Expiration Date: 202111192359
Unit Type and Rh: 7300
Unit Type and Rh: 7300

## 2020-02-09 ENCOUNTER — Ambulatory Visit (INDEPENDENT_AMBULATORY_CARE_PROVIDER_SITE_OTHER): Payer: Self-pay

## 2020-02-09 ENCOUNTER — Other Ambulatory Visit: Payer: Self-pay

## 2020-02-09 DIAGNOSIS — Z4802 Encounter for removal of sutures: Secondary | ICD-10-CM

## 2020-02-09 NOTE — Progress Notes (Signed)
Patient arrived for nurse visit to remove one suture post- procedure RATS/ right upper lobectomy with Dr. Dorris Fetch 01/29/2020.  Suture removed with no signs/ symptoms of infection noted.  Patient tolerated procedure well.  No dehisence noted.  Patient/ family instructed to keep the incision sites clean and dry.  Patient/ family acknowledged instructions given. Advised to contact the office with concerns or signs/ symptoms of infection.  She acknowledged receipt.  Patient was eager to get back to work.  Stated that she was feeling well, not taking any pain medication and keeping active.  She stated that she does not have a strenuous job.  Advised that normally, patient's do not go back to work before their first post-op appointment, but that I would ask Dr. Dorris Fetch.  She acknowledged receipt.

## 2020-02-12 ENCOUNTER — Telehealth: Payer: Self-pay

## 2020-02-12 NOTE — Telephone Encounter (Signed)
-----   Message from [name], MD sent at 02/09/2020  3:23 PM EST ----- Regarding: RE: Return to work OK to go back to that at 2 weeks which will be Monday   Can't left anything over 10 pounds until she sees me  [facility] ----- Message ----- From: Steve Rattler, RN Sent: 02/09/2020  12:18 PM EST To: [name], MD Subject: Return to work                                 Hey,  She came in to have her sutures removed.  She really wants to return to work. 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  She stated that she was not hurting and her incisions looked good.  I advised that usually you are not cleared until the first post-op appointment, but she really wanted me to ask.  Please advise.  Thanks,  Morrie Sheldon

## 2020-02-12 NOTE — Telephone Encounter (Signed)
Patient called back asking if she could drive after being cleared to go back to work under weight restrictions.  Advised that she could drive as long as she is not taking any narcotic pain medications and that she start driving short distances.  She acknowledged receipt.

## 2020-02-12 NOTE — Telephone Encounter (Signed)
Patient aware that she can return to work Monday, 02/12/2020 with a 10 pound weight restriction per Dr. Dorris Fetch.  Patient is to been seen in the clinic for further instruction 02/27/20.  She acknowledged receipt and a letter was sent to patient for employer at patient's request.

## 2020-02-21 ENCOUNTER — Other Ambulatory Visit: Payer: Self-pay | Admitting: Thoracic Surgery (Cardiothoracic Vascular Surgery)

## 2020-02-21 DIAGNOSIS — Z902 Acquired absence of lung [part of]: Secondary | ICD-10-CM

## 2020-02-27 ENCOUNTER — Ambulatory Visit
Admission: RE | Admit: 2020-02-27 | Discharge: 2020-02-27 | Disposition: A | Discharge status: Home / Self Care | Payer: BC Managed Care – PPO | Source: Ambulatory Visit | Attending: Thoracic Surgery (Cardiothoracic Vascular Surgery) | Admitting: Thoracic Surgery (Cardiothoracic Vascular Surgery)

## 2020-02-27 ENCOUNTER — Other Ambulatory Visit: Payer: Self-pay

## 2020-02-27 ENCOUNTER — Encounter: Payer: Self-pay | Admitting: Thoracic Surgery (Cardiothoracic Vascular Surgery)

## 2020-02-27 ENCOUNTER — Ambulatory Visit (INDEPENDENT_AMBULATORY_CARE_PROVIDER_SITE_OTHER): Payer: Self-pay | Admitting: Thoracic Surgery (Cardiothoracic Vascular Surgery)

## 2020-02-27 VITALS — BP 108/74 | HR 90 | Resp 18 | Ht 66.0 in | Wt 142.0 lb

## 2020-02-27 DIAGNOSIS — Z902 Acquired absence of lung [part of]: Secondary | ICD-10-CM
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
## 2020-03-10 ENCOUNTER — Encounter: Payer: Self-pay | Admitting: Thoracic Surgery (Cardiothoracic Vascular Surgery)

## 2020-03-12 ENCOUNTER — Encounter: Payer: Self-pay | Admitting: *Deleted

## 2020-03-12 ENCOUNTER — Other Ambulatory Visit: Payer: Self-pay

## 2020-03-12 ENCOUNTER — Ambulatory Visit (INDEPENDENT_AMBULATORY_CARE_PROVIDER_SITE_OTHER): Payer: BC Managed Care – PPO | Admitting: Women's Health

## 2020-03-12 ENCOUNTER — Encounter: Payer: Self-pay | Admitting: Women's Health

## 2020-03-12 VITALS — BP 91/58 | HR 87 | Ht 66.0 in | Wt 137.0 lb

## 2020-03-12 DIAGNOSIS — Z3046 Encounter for surveillance of implantable subdermal contraceptive: Secondary | ICD-10-CM | POA: Diagnosis not present

## 2020-03-12 NOTE — Progress Notes (Signed)
   NEXPLANON REMOVAL Patient name: Pam Clark MRN [id number]  Date of birth: 1995-08-07 Subjective Findings:   Pam Clark is a 23 y.o. G0P0000 African American female being seen today for removal of a Nexplanon. Her Nexplanon was placed 08/25/18.  She desires removal because of weight loss and no periods, likes to have periods. Signed copy of informed consent in chart.   Patient's last menstrual period was 02/25/2020 (approximate). Last pap never. Results were:  n/a The planned method of family planning is abstinence, then maybe pills or something in January No flowsheet data found.  Pertinent History Reviewed:   Reviewed past medical,surgical, social, obstetrical and family history.  Reviewed problem list, medications and allergies. Objective Findings & Procedure:    Vitals:   03/12/20 1414  BP: (!) 91/58  Pulse: 87  Weight: 137 lb (62.1 kg)  Height: 5\' 6"  (1.676 m)  Body mass index is 22.11 kg/m.  No results found for this or any previous visit (from the past 24 hour(s)).   Time out was performed.  Nexplanon site identified.  Area prepped in usual sterile fashon. One cc of 2% lidocaine was used to anesthetize the area at the distal end of the implant. A small stab incision was made right beside the implant on the distal portion.  The Nexplanon rod was grasped using hemostats and removed without difficulty.  There was less than 3 cc blood loss. There were no complications.  Steri-strips were applied over the small incision and a pressure bandage was applied.  The patient tolerated the procedure well. Assessment & Plan:   1) Nexplanon removal She was instructed to keep the area clean and dry, remove pressure bandage in 24 hours, and keep insertion site covered with the steri-strip for 3-5 days.   Follow-up PRN problems.  No orders of the defined types were placed in this encounter.   Follow-up: Return in about 1 month (around 04/12/2020) for Pap &  physical.  04/14/2020 CNM, [facility] 03/12/2020 2:50 PM

## 2020-03-12 NOTE — Patient Instructions (Signed)
Keep the area clean and dry.  You can remove the big bandage in 24 hours, and the small steri-strip bandage in 3-5 days.  

## 2020-04-22 ENCOUNTER — Other Ambulatory Visit: Payer: BC Managed Care – PPO | Admitting: Women's Health

## 2020-05-31 ENCOUNTER — Ambulatory Visit (INDEPENDENT_AMBULATORY_CARE_PROVIDER_SITE_OTHER): Payer: Managed Care, Other (non HMO) | Admitting: Women's Health

## 2020-05-31 ENCOUNTER — Other Ambulatory Visit (HOSPITAL_COMMUNITY)
Admission: RE | Admit: 2020-05-31 | Discharge: 2020-05-31 | Disposition: A | Discharge status: Home / Self Care | Payer: Managed Care, Other (non HMO) | Source: Ambulatory Visit | Attending: Obstetrics & Gynecology | Admitting: Obstetrics & Gynecology

## 2020-05-31 ENCOUNTER — Encounter: Payer: Self-pay | Admitting: Women's Health

## 2020-05-31 ENCOUNTER — Other Ambulatory Visit: Payer: Self-pay

## 2020-05-31 VITALS — BP 92/64 | HR 66 | Ht 66.0 in | Wt 145.2 lb

## 2020-05-31 DIAGNOSIS — Z01419 Encounter for gynecological examination (general) (routine) without abnormal findings: Secondary | ICD-10-CM

## 2020-05-31 DIAGNOSIS — Z30016 Encounter for initial prescription of transdermal patch hormonal contraceptive device: Secondary | ICD-10-CM | POA: Diagnosis not present

## 2020-05-31 LAB — POCT URINE PREGNANCY: Preg Test, Ur: NEGATIVE

## 2020-05-31 MED ORDER — NORELGESTROMIN-ETH ESTRADIOL 150-35 MCG/24HR TD PTWK: 1.0000 | MEDICATED_PATCH | TRANSDERMAL | 4 refills | Status: DC

## 2020-05-31 NOTE — Progress Notes (Signed)
WELL-WOMAN EXAMINATION Patient name: Pam Clark MRN [id number]  Date of birth: [birth year] Chief Complaint:   Contraception  History of Present Illness:   Pam Clark is a 25 y.o. G0P0000 African American female being seen today for a routine well-woman exam.  Current complaints: wants birth control patch  No flowsheet data found.   PCP: Robbie Lis      does not desire labs Patient's last menstrual period was 05/28/2020 (exact date). The current method of family planning is none.  Last pap never. Results were: N/A. H/O abnormal pap: no Last mammogram: never. Results were: N/A. Family h/o breast cancer: no Last colonoscopy: never. Results were: N/A. Family h/o colorectal cancer: no Review of Systems:   Pertinent items are noted in HPI Denies any headaches, blurred vision, fatigue, shortness of breath, chest pain, abdominal pain, abnormal vaginal discharge/itching/odor/irritation, problems with periods, bowel movements, urination, or intercourse unless otherwise stated above. Pertinent History Reviewed:  Reviewed past medical,surgical, social and family history.  Reviewed problem list, medications and allergies. Physical Assessment:   Vitals:   05/31/20 1011  BP: 92/64  Pulse: 66  Weight: 145 lb 3.2 oz (65.9 kg)  Height: 5\' 6"  (1.676 m)  Body mass index is 23.44 kg/m.        Physical Examination:   General appearance - well appearing, and in no distress  Mental status - alert, oriented to person, place, and time  Psych:  She has a normal mood and affect  Skin - warm and dry, normal color, no suspicious lesions noted  Chest - effort normal, all lung fields clear to auscultation bilaterally  Heart - normal rate and regular rhythm  Neck:  midline trachea, no thyromegaly or nodules  Breasts - breasts appear normal, no suspicious masses, no skin or nipple changes or  axillary nodes  Abdomen - soft, nontender, nondistended, no masses or organomegaly  Pelvic -  VULVA: normal appearing vulva with no masses, tenderness or lesions  VAGINA: normal appearing vagina with normal color and discharge, no lesions  CERVIX: normal appearing cervix without discharge or lesions, no CMT  Thin prep pap is done w/ HR HPV cotesting  UTERUS: uterus is felt to be normal size, shape, consistency and nontender   ADNEXA: No adnexal masses or tenderness noted.  Extremities:  No swelling or varicosities noted  Chaperone:    Results for orders placed or performed in visit on 05/31/20 (from the past 24 hour(s))  POCT urine pregnancy   Collection Time: 05/31/20 10:13 AM  Result Value Ref Range   Preg Test, Ur Negative Negative    Assessment & Plan:  1) Well-Woman Exam  2) Contraception management- rx ortho evra patches, wants to wear continuously. To take off q31mths for period  3) STD screen  Labs/procedures today: pap  Mammogram: @ 23yo, or sooner if problems Colonoscopy: @ 23yo, or sooner if problems  Orders Placed This Encounter  Procedures  . POCT urine pregnancy    Meds:  Meds ordered this encounter  Medications  . norelgestromin-ethinyl estradiol (ORTHO EVRA) 150-35 MCG/24HR transdermal patch    Sig: Place 1 patch onto the skin once a week.    Dispense:  9 patch    Refill:  4    Order Specific Question:   Supervising Provider    Answer:   1m [[id number]]    Follow-up: Return in about 3 months (around 08/31/2020) for med f/u, CNM.  10/31/2020 CNM, [facility] 05/31/2020 10:52 AM

## 2020-06-03 ENCOUNTER — Emergency Department (INDEPENDENT_AMBULATORY_CARE_PROVIDER_SITE_OTHER)
Admission: RE | Admit: 2020-06-03 | Discharge: 2020-06-03 | Disposition: A | Discharge status: Home / Self Care | Payer: Managed Care, Other (non HMO) | Source: Ambulatory Visit | Attending: Family Medicine | Admitting: Family Medicine

## 2020-06-03 ENCOUNTER — Other Ambulatory Visit: Payer: Self-pay

## 2020-06-03 ENCOUNTER — Other Ambulatory Visit (HOSPITAL_COMMUNITY)
Admission: RE | Admit: 2020-06-03 | Discharge: 2020-06-03 | Disposition: A | Discharge status: Home / Self Care | Payer: Managed Care, Other (non HMO) | Source: Ambulatory Visit | Attending: Family Medicine | Admitting: Family Medicine

## 2020-06-03 VITALS — BP 106/74 | HR 88 | Temp 98.6°F | Resp 16

## 2020-06-03 DIAGNOSIS — Z0189 Encounter for other specified special examinations: Secondary | ICD-10-CM

## 2020-06-03 DIAGNOSIS — J020 Streptococcal pharyngitis: Secondary | ICD-10-CM | POA: Diagnosis not present

## 2020-06-03 DIAGNOSIS — Z113 Encounter for screening for infections with a predominantly sexual mode of transmission: Secondary | ICD-10-CM | POA: Diagnosis not present

## 2020-06-03 LAB — POCT RAPID STREP A (OFFICE): Rapid Strep A Screen: POSITIVE — AB

## 2020-06-03 LAB — CYTOLOGY - PAP
Chlamydia: NEGATIVE
Comment: NEGATIVE
Comment: NORMAL
Diagnosis: NEGATIVE
Neisseria Gonorrhea: NEGATIVE

## 2020-06-03 LAB — POCT URINE PREGNANCY: Preg Test, Ur: NEGATIVE

## 2020-06-03 MED ORDER — AZITHROMYCIN 250 MG PO TABS: ORAL_TABLET | ORAL | 0 refills | Status: DC
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
## 2020-06-03 NOTE — Discharge Instructions (Signed)
Drink plenty of fluid Take antibiotic as directed

## 2020-06-03 NOTE — ED Triage Notes (Signed)
Patient presents to Urgent Care with complaints of request for routine screening for STDs, pregnancy test since she has the tests done every month. Patient reports she is not having symptoms of STDs, no known exposures, also requesting pregnancy test and strep test.

## 2020-06-04 LAB — CERVICOVAGINAL ANCILLARY ONLY
Chlamydia: POSITIVE — AB
Comment: NEGATIVE
Comment: NEGATIVE
Comment: NORMAL
Neisseria Gonorrhea: NEGATIVE
Trichomonas: NEGATIVE

## 2020-06-05 ENCOUNTER — Telehealth (HOSPITAL_COMMUNITY): Payer: Self-pay | Admitting: Emergency Medicine

## 2020-06-05 MED ORDER — DOXYCYCLINE HYCLATE 100 MG PO CAPS: 100.0000 mg | ORAL_CAPSULE | Freq: Two times a day (BID) | ORAL | 0 refills | Status: AC

## 2020-06-16 ENCOUNTER — Other Ambulatory Visit: Payer: Self-pay

## 2020-06-16 ENCOUNTER — Encounter: Payer: Self-pay | Admitting: Thoracic Surgery (Cardiothoracic Vascular Surgery)

## 2020-06-16 ENCOUNTER — Emergency Department (INDEPENDENT_AMBULATORY_CARE_PROVIDER_SITE_OTHER)
Admission: RE | Admit: 2020-06-16 | Discharge: 2020-06-16 | Disposition: A | Discharge status: Home / Self Care | Payer: Managed Care, Other (non HMO) | Source: Ambulatory Visit

## 2020-06-16 ENCOUNTER — Emergency Department (INDEPENDENT_AMBULATORY_CARE_PROVIDER_SITE_OTHER): Payer: Managed Care, Other (non HMO)

## 2020-06-16 VITALS — BP 105/61 | HR 81 | Temp 98.0°F | Resp 16

## 2020-06-16 DIAGNOSIS — R079 Chest pain, unspecified: Secondary | ICD-10-CM

## 2020-06-16 DIAGNOSIS — R0789 Other chest pain: Secondary | ICD-10-CM | POA: Diagnosis not present

## 2020-06-16 DIAGNOSIS — A749 Chlamydial infection, unspecified: Secondary | ICD-10-CM | POA: Diagnosis not present

## 2020-06-16 DIAGNOSIS — R1111 Vomiting without nausea: Secondary | ICD-10-CM

## 2020-06-16 MED ORDER — ONDANSETRON HCL 4 MG PO TABS
4.0000 mg | ORAL_TABLET | Freq: Once | ORAL | Status: AC
  Administered 2020-06-16: 4 mg via ORAL

## 2020-06-16 MED ORDER — AZITHROMYCIN 500 MG PO TABS
1000.0000 mg | ORAL_TABLET | Freq: Once | ORAL | Status: AC
  Administered 2020-06-16: 1000 mg via ORAL

## 2020-06-16 NOTE — Discharge Instructions (Signed)
Contact cardiothoracic surgeon Go to ED with any worsening symptoms.
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
## 2020-06-16 NOTE — ED Triage Notes (Addendum)
Pain to sternum & under right rib cage the last 2 weeks  Pt had a chest tube in place in November 2021 & states it feels like the tube is still there  Unable to complete doxycyline treatment for STD due to N/V ( took with food)- pt had 5 doses left

## 2020-06-18 ENCOUNTER — Other Ambulatory Visit: Payer: Self-pay | Admitting: Adult Health

## 2020-06-18 ENCOUNTER — Telehealth: Payer: Self-pay

## 2020-06-18 MED ORDER — FLUCONAZOLE 150 MG PO TABS: ORAL_TABLET | ORAL | 1 refills | Status: DC

## 2020-06-18 NOTE — Progress Notes (Signed)
rx diflucan  

## 2020-07-15 ENCOUNTER — Other Ambulatory Visit: Payer: Self-pay | Admitting: Thoracic Surgery (Cardiothoracic Vascular Surgery)

## 2020-07-15 DIAGNOSIS — Z902 Acquired absence of lung [part of]: Secondary | ICD-10-CM

## 2020-07-16 ENCOUNTER — Encounter: Payer: Managed Care, Other (non HMO) | Admitting: Thoracic Surgery (Cardiothoracic Vascular Surgery)

## 2020-07-18 ENCOUNTER — Other Ambulatory Visit: Payer: Self-pay | Admitting: Adult Health

## 2020-07-18 MED ORDER — AZITHROMYCIN 500 MG PO TABS
ORAL_TABLET | ORAL | 0 refills | Status: DC
Start: 2020-07-18 — End: 2020-11-15

## 2020-07-18 NOTE — Progress Notes (Signed)
Will rx azithromycin 500 mg 2 po now

## 2020-08-13 ENCOUNTER — Encounter: Payer: Managed Care, Other (non HMO) | Admitting: Thoracic Surgery (Cardiothoracic Vascular Surgery)

## 2020-08-26 ENCOUNTER — Emergency Department (INDEPENDENT_AMBULATORY_CARE_PROVIDER_SITE_OTHER)
Admission: RE | Admit: 2020-08-26 | Discharge: 2020-08-26 | Disposition: A | Discharge status: Home / Self Care | Payer: Managed Care, Other (non HMO) | Source: Ambulatory Visit | Attending: Family Medicine | Admitting: Family Medicine

## 2020-08-26 ENCOUNTER — Other Ambulatory Visit: Payer: Self-pay

## 2020-08-26 VITALS — BP 109/69 | HR 79 | Temp 98.3°F

## 2020-08-26 DIAGNOSIS — N946 Dysmenorrhea, unspecified: Secondary | ICD-10-CM | POA: Diagnosis not present

## 2020-08-26 DIAGNOSIS — R197 Diarrhea, unspecified: Secondary | ICD-10-CM

## 2020-08-26 DIAGNOSIS — R112 Nausea with vomiting, unspecified: Secondary | ICD-10-CM

## 2020-08-26 LAB — POCT URINE PREGNANCY: Preg Test, Ur: NEGATIVE

## 2020-08-26 MED ORDER — ONDANSETRON 4 MG PO TBDP: ORAL_TABLET | ORAL | 0 refills | Status: DC

## 2020-08-26 NOTE — ED Triage Notes (Signed)
Patient is concerned for pregnancy.  Patient is currently on a birth control patch and forgot to place the patch on.  Patient was vomiting today at work.  Unsure when LMP was.
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
## 2020-08-26 NOTE — Discharge Instructions (Addendum)
Begin clear liquids today, and gradually resume normal diet tomorrow if improved.  If symptoms become significantly worse during the night or over the weekend, proceed to the local emergency room.

## 2020-09-02 ENCOUNTER — Encounter: Payer: Self-pay | Admitting: Women's Health

## 2020-09-02 ENCOUNTER — Other Ambulatory Visit: Payer: Self-pay

## 2020-09-02 ENCOUNTER — Ambulatory Visit (INDEPENDENT_AMBULATORY_CARE_PROVIDER_SITE_OTHER): Payer: Managed Care, Other (non HMO) | Admitting: Women's Health

## 2020-09-02 VITALS — BP 96/59 | HR 57 | Wt 150.0 lb

## 2020-09-02 DIAGNOSIS — Z30017 Encounter for initial prescription of implantable subdermal contraceptive: Secondary | ICD-10-CM | POA: Diagnosis not present

## 2020-09-02 MED ORDER — ETONOGESTREL 68 MG ~~LOC~~ IMPL
68.0000 mg | DRUG_IMPLANT | Freq: Once | SUBCUTANEOUS | Status: AC
  Administered 2020-09-02: 68 mg via SUBCUTANEOUS

## 2020-09-02 NOTE — Patient Instructions (Signed)
Keep the area clean and dry.  You can remove the big bandage in 24 hours, and the small steri-strip bandage in 3-5 days.  A back up method, such as condoms, should be used for two weeks. You may have irregular vaginal bleeding for the first 6 months after the Nexplanon is placed, then the bleeding usually lightens and it is possible that you may not have any periods.  If you have any concerns, please give us a call.    Etonogestrel implant What is this medicine? ETONOGESTREL (et oh noe JES trel) is a contraceptive (birth control) device. It is used to prevent pregnancy. It can be used for up to 3 years. This medicine may be used for other purposes; ask your health care provider or pharmacist if you have questions. COMMON BRAND NAME(S): Implanon, Nexplanon What should I tell my health care provider before I take this medicine? They need to know if you have any of these conditions:  abnormal vaginal bleeding  blood vessel disease or blood clots  breast, cervical, endometrial, ovarian, liver, or uterine cancer  diabetes  gallbladder disease  heart disease or recent heart attack  high blood pressure  high cholesterol or triglycerides  kidney disease  liver disease  migraine headaches  seizures  stroke  tobacco smoker  an unusual or allergic reaction to etonogestrel, anesthetics or antiseptics, other medicines, foods, dyes, or preservatives  pregnant or trying to get pregnant  breast-feeding How should I use this medicine? This device is inserted just under the skin on the inner side of your upper arm by a health care professional. Talk to your pediatrician regarding the use of this medicine in children. Special care may be needed. Overdosage: If you think you have taken too much of this medicine contact a poison control center or emergency room at once. NOTE: This medicine is only for you. Do not share this medicine with others. What if I miss a dose? This does not  apply. What may interact with this medicine? Do not take this medicine with any of the following medications:  amprenavir  fosamprenavir This medicine may also interact with the following medications:  acitretin  aprepitant  armodafinil  bexarotene  bosentan  carbamazepine  certain medicines for fungal infections like fluconazole, ketoconazole, itraconazole and voriconazole  certain medicines to treat hepatitis, HIV or AIDS  cyclosporine  felbamate  griseofulvin  lamotrigine  modafinil  oxcarbazepine  phenobarbital  phenytoin  primidone  rifabutin  rifampin  rifapentine  St. John's wort  topiramate This list may not describe all possible interactions. Give your health care provider a list of all the medicines, herbs, non-prescription drugs, or dietary supplements you use. Also tell them if you smoke, drink alcohol, or use illegal drugs. Some items may interact with your medicine. What should I watch for while using this medicine? This product does not protect you against HIV infection (AIDS) or other sexually transmitted diseases. You should be able to feel the implant by pressing your fingertips over the skin where it was inserted. Contact your doctor if you cannot feel the implant, and use a non-hormonal birth control method (such as condoms) until your doctor confirms that the implant is in place. Contact your doctor if you think that the implant may have broken or become bent while in your arm. You will receive a user card from your health care provider after the implant is inserted. The card is a record of the location of the implant in your upper arm   and when it should be removed. Keep this card with your health records. What side effects may I notice from receiving this medicine? Side effects that you should report to your doctor or health care professional as soon as possible:  allergic reactions like skin rash, itching or hives, swelling of the  face, lips, or tongue  breast lumps, breast tissue changes, or discharge  breathing problems  changes in emotions or moods  coughing up blood  if you feel that the implant may have broken or bent while in your arm  high blood pressure  pain, irritation, swelling, or bruising at the insertion site  scar at site of insertion  signs of infection at the insertion site such as fever, and skin redness, pain or discharge  signs and symptoms of a blood clot such as breathing problems; changes in vision; chest pain; severe, sudden headache; pain, swelling, warmth in the leg; trouble speaking; sudden numbness or weakness of the face, arm or leg  signs and symptoms of liver injury like dark yellow or brown urine; general ill feeling or flu-like symptoms; light-colored stools; loss of appetite; nausea; right upper belly pain; unusually weak or tired; yellowing of the eyes or skin  unusual vaginal bleeding, discharge Side effects that usually do not require medical attention (report to your doctor or health care professional if they continue or are bothersome):  acne  breast pain or tenderness  headache  irregular menstrual bleeding  nausea This list may not describe all possible side effects. Call your doctor for medical advice about side effects. You may report side effects to FDA at 1-800-FDA-1088. Where should I keep my medicine? This drug is given in a hospital or clinic and will not be stored at home. NOTE: This sheet is a summary. It may not cover all possible information. If you have questions about this medicine, talk to your doctor, pharmacist, or health care provider.  2021 Elsevier/Gold Standard (2018-12-27 11:33:04)  

## 2020-09-02 NOTE — Progress Notes (Signed)
   NEXPLANON INSERTION Patient name: Pam Clark MRN [id number]  Date of birth: [birth year] Subjective Findings:   Pam Clark is a 25 y.o. G0P0000 African American female being seen today for insertion of a Nexplanon. Tried ortho evra patches- keep coming off.  Patient's last menstrual period was 08/29/2020. Last sexual intercourse was: n/a is on period Last pap3/4/22. Results were: NILM w/ HRHPV not done  Risks/benefits/side effects of Nexplanon have been discussed and her questions have been answered.  Specifically, a failure rate of 03/998 has been reported, with an increased failure rate if pt takes St. John's Wort and/or antiseizure medicaitons.  She is aware of the common side effect of irregular bleeding, which the incidence of decreases over time. Signed copy of informed consent in chart.   No flowsheet data found.  No flowsheet data found.   Pertinent History Reviewed:   Reviewed past medical,surgical, social, obstetrical and family history.  Reviewed problem list, medications and allergies. Objective Findings & Procedure:    Vitals:   09/02/20 1022  BP: (!) 96/59  Pulse: (!) 57  Weight: 150 lb (68 kg)  Body mass index is 24.21 kg/m.  No results found for this or any previous visit (from the past 24 hour(s)).   Time out was performed.  She is right-handed, so her left arm, approximately 10cm from the medial epicondyle and 3-5cm posterior to the sulcus, was cleansed with alcohol and anesthetized with 2cc of 2% Lidocaine.  The area was cleansed again with betadine and the Nexplanon was inserted per manufacturer's recommendations without difficulty.  3 steri-strips and pressure bandage were applied. The patient tolerated the procedure well.  Assessment & Plan:   1) Nexplanon insertion Pt was instructed to keep the area clean and dry, remove pressure bandage in 24 hours, and keep insertion site covered with the steri-strip for 3-5 days.  Back up  contraception was recommended for 2 weeks.  She was given a card indicating date Nexplanon was inserted and date it needs to be removed. Follow-up PRN problems.  No orders of the defined types were placed in this encounter.   Follow-up: Return in about 1 year (around 09/02/2021) for Physical.  Cheral Marker CNM, [facility] 09/02/2020 10:49 AM

## 2020-10-07 ENCOUNTER — Encounter: Payer: Self-pay | Admitting: Physician Assistant

## 2020-10-07 ENCOUNTER — Telehealth: Payer: Managed Care, Other (non HMO) | Admitting: Physician Assistant

## 2020-10-07 DIAGNOSIS — R102 Pelvic and perineal pain: Secondary | ICD-10-CM

## 2020-10-07 DIAGNOSIS — N760 Acute vaginitis: Secondary | ICD-10-CM

## 2020-10-07 DIAGNOSIS — B9689 Other specified bacterial agents as the cause of diseases classified elsewhere: Secondary | ICD-10-CM

## 2020-10-07 MED ORDER — METRONIDAZOLE 500 MG PO TABS: 500.0000 mg | ORAL_TABLET | Freq: Two times a day (BID) | ORAL | 0 refills | Status: AC

## 2020-10-07 NOTE — Patient Instructions (Addendum)
Dyspareunia, Female Dyspareunia is pain that is associated with sexual activity. This can affect any part of the genitals or lower abdomen. There are many possible causes of this condition. In some cases, diagnosing the cause of dyspareunia can bedifficult. This condition can be mild, moderate, or severe. Depending on the cause, dyspareunia may get better with treatment, but may return (recur) over time. What are the causes?  The cause of this condition is not always known. However, problems that affectthe vulva, vagina, uterus, and other organs may cause dyspareunia. Common causes of this condition include: Severe pain and tenderness of the vulva when it is touched (vulvodynia). Vaginal dryness. Giving birth. Infection. Skin changes or conditions. Side effects of medicines. Endometriosis. This is when tissue that is like the lining of the uterus grows on the outside of the uterus. Psychological conditions. These include depression, anxiety, or traumatic experiences. Allergic reaction. What increases the risk? The following factors may make you more likely to develop this condition: History of physical or sexual trauma. Some medicines. No longer having a monthly period (menopause). Having recently given birth. Taking baths using soaps that have perfumes. These can cause irritation. Douching. What are the signs or symptoms? The main symptom of this condition is pain in any part of your genitals or lower abdomen during or after sex. This may include: Irritation, burning, or stinging sensations in your vulva. Discomfort when your vulva or surrounding area is touched. Aching and throbbing pain that may be constant. Pain that gets worse when something is inserted into your vagina. How is this diagnosed? This condition may be diagnosed based on: Your symptoms, including where and when your pain occurs. Your medical history. A physical exam. A pelvic exam will most likely be done. Tests  that include ultrasound, blood tests, and tests that check the body for infection. Imaging tests, such as X-ray, MRI, and CT scan. You may be referred to a health care provider who specializes in women's health (gynecologist). How is this treated? Treatment depends on the cause of your condition and your symptoms. In most cases, you may need to stop sexual activity until your symptoms go away or get better. Treatment may include: Lubricants, ointments, and creams. Physical therapy. Massage therapy. Hormonal therapy. Medicines to: Prevent or fight infection. Relieve pain. Help numb the area. Treat depression (antidepressants). Counseling, which may include sex therapy. Surgery. Follow these instructions at home: Lifestyle Wear cotton underwear. Use water-based lubricants as needed during sex. Avoid oil-based lubricants. Do not use any products that can cause irritation. This may include certain condoms, spermicides, lubricants, soaps, tampons, vaginal sprays, or douches. Always practice safe sex. Use a condom to prevent sexually transmitted infections (STIs). Talk freely with your partner about your condition. General instructions Take or apply over-the-counter and prescription medicines only as told by your health care provider. Urinate before you have sex. Consider joining a support group. Get the results of any tests you have done. Ask your health care provider, or the department that is doing the procedure, when your results will be ready. Keep all follow-up visits as told by your health care provider. This is important. Contact a health care provider if: You have vaginal bleeding after having sex. You develop a lump at the opening of your vagina even if the lump is painless. You have: Abnormal discharge from your vagina. Vaginal dryness. Itchiness or irritation of your vulva or vagina. A new rash. Symptoms that get worse or do not improve with treatment. A fever. Pain  when  you urinate. Blood in your urine. Get help right away if: You have severe pain in your abdomen during or shortly after sex. You pass out after sex. Summary Dyspareunia is pain that is associated with sexual activity. This can affect any part of the genitals or lower abdomen. There are many causes of this condition. Treatment depends on the cause and your symptoms. In most cases, you may need to stop sexual activity until your symptoms improve. Take or apply over-the-counter and prescription medicines only as told by your health care provider. Contact a health care provider if your symptoms get worse or do not improve with treatment. Keep all follow-up visits as told by your health care provider. This is important. This information is not intended to replace advice given to you by your health care provider. Make sure you discuss any questions you have with your healthcare provider. Document Revised: 04/27/2019 Document Reviewed: 05/23/2018 Elsevier Patient Education  2022 Elsevier Inc.   Bacterial Vaginosis  Bacterial vaginosis is an infection of the vagina. It happens when too many normal germs (healthy bacteria) grow in the vagina. This infection can make it easier to get other infectionsfrom sex (STIs). It is very important for pregnant women to get treated. This infection cancause babies to be born early or at a low birth weight. What are the causes? This infection is caused by an increase in certain germs that grow in [place]. You cannot get this infection from toilet seats, bedsheets, swimming pools, orthings that touch your vagina. What increases the risk? Having sex with a new person or more than one person. Having sex without protection. Douching. Having an intrauterine device (IUD). Smoking. Using drugs or drinking alcohol. These can lead you to do things that are risky. Taking certain antibiotic medicines. Being pregnant. What are the signs or symptoms? Some women have  no symptoms. Symptoms may include: A discharge from your vagina. It may be gray or white. It can be watery or foamy. A fishy smell. This can happen after sex or during your menstrual period. Itching in and around your vagina. A feeling of burning or pain when you pee (urinate). How is this treated? This infection is treated with antibiotic medicines. These may be given to you as: A pill. A cream for your vagina. A medicine that you put into your vagina (suppository). If the infection comes back after treatment, you may need more antibiotics. Follow these instructions at home: Medicines Take over-the-counter and prescription medicines as told by your doctor. Take or use your antibiotic medicine as told by your doctor. Do not stop taking or using it, even if you start to feel better. General instructions If the person you have sex with is a woman, tell her that you have this infection. She will need to follow up with her doctor. If you have a female partner, he does not need to be treated. Do not have sex until you finish treatment. Drink enough fluid to keep your pee pale yellow. Keep your vagina and butt clean. Wash the area with warm water each day. Wipe from front to back after you use the toilet. If you are breastfeeding a baby, ask your doctor if you should keep doing so during treatment. Keep all follow-up visits. How is this prevented? Self-care Do not douche. Use only warm water to wash around your vagina. Wear underwear that is cotton or lined with cotton. Do not wear tight pants and pantyhose, especially in the summer. Safe sex Use  protection when you have sex. This includes: Use condoms. Use dental dams. This is a thin layer that protects the mouth during oral sex. Limit how many people you have sex with. To prevent this infection, it is best to have sex with just one person. Get tested for STIs. The person you have sex with should also get tested. Drugs and alcohol Do  not smoke or use any products that contain nicotine or tobacco. If you need help quitting, ask your doctor. Do not use drugs. Do not drink alcohol if: Your doctor tells you not to drink. You are pregnant, may be pregnant, or are planning to become pregnant. If you drink alcohol: Limit how much you have to 0-1 drink a day. Know how much alcohol is in your drink. In the [place] one drink equals one 12 oz bottle of beer (355 mL), one 5 oz glass of wine (148 mL), or one 1 oz glass of hard liquor (44 mL). Where to find more information Centers for Disease Control and Prevention: FootballExhibition.com.br American Sexual Health Association: www.ashastd.org Office on [employer] Health: http://hoffman.com/ Contact a doctor if: Your symptoms do not get better, even after you are treated. You have more discharge or pain when you pee. You have a fever or chills. You have pain in your belly (abdomen) or in the area between your hips. You have pain with sex. You bleed from your vagina between menstrual periods. Summary This infection can happen when too many germs (bacteria) grow in the vagina. This infection can make it easier to get infections from sex (STIs). Treating this can lower that chance. Get treated if you are pregnant. This infection can cause babies to be born early. Do not stop taking or using your antibiotic medicine, even if you start to feel better. This information is not intended to replace advice given to you by your health care provider. Make sure you discuss any questions you have with your healthcare provider. Document Revised: 09/14/2019 Document Reviewed: 09/14/2019 Elsevier Patient Education  2022 [employer].

## 2020-10-07 NOTE — Progress Notes (Signed)
Ms. Pam Clark, wack are scheduled for a virtual visit with your provider today.    Just as we do with appointments in the office, we must obtain your consent to participate.  Your consent will be active for this visit and any virtual visit you may have with one of our providers in the next 365 days.    If you have a MyChart account, I can also send a copy of this consent to you electronically.  All virtual visits are billed to your insurance company just like a traditional visit in the office.  As this is a virtual visit, video technology does not allow for your provider to perform a traditional examination.  This may limit your provider's ability to fully assess your condition.  If your provider identifies any concerns that need to be evaluated in person or the need to arrange testing such as labs, EKG, etc, we will make arrangements to do so.    Although advances in technology are sophisticated, we cannot ensure that it will always work on either your end or our end.  If the connection with a video visit is poor, we may have to switch to a telephone visit.  With either a video or telephone visit, we are not always able to ensure that we have a secure connection.   I need to obtain your verbal consent now.   Are you willing to proceed with your visit today?   Charon Smedberg has provided verbal consent on 10/07/2020 for a virtual visit (video or telephone).   [name], PA-C 10/07/2020  12:13 PM  Virtual Visit Consent   Pam Clark, you are scheduled for a virtual visit with a [facility] provider today.     Just as with appointments in the office, your consent must be obtained to participate.  Your consent will be active for this visit and any virtual visit you may have with one of our providers in the next 365 days.     If you have a MyChart account, a copy of this consent can be sent to you electronically.  All virtual visits are billed to your insurance company just like a  traditional visit in the office.    As this is a virtual visit, video technology does not allow for your provider to perform a traditional examination.  This may limit your provider's ability to fully assess your condition.  If your provider identifies any concerns that need to be evaluated in person or the need to arrange testing (such as labs, EKG, etc.), we will make arrangements to do so.     Although advances in technology are sophisticated, we cannot ensure that it will always work on either your end or our end.  If the connection with a video visit is poor, the visit may have to be switched to a telephone visit.  With either a video or telephone visit, we are not always able to ensure that we have a secure connection.     I need to obtain your verbal consent now.   Are you willing to proceed with your visit today?    Roselia Snipe has provided verbal consent on 10/07/2020 for a virtual visit (video or telephone).   [name], PA-C   Date: 10/07/2020 12:13 PM   Virtual Visit via Video Note   I, [name], connected with  Pam Clark  ([id number], 29-Sep-1995) on 10/07/20 at 11:45 AM EDT by a video-enabled telemedicine application and  verified that I am speaking with the correct person using two identifiers.  Location: Patient: Virtual Visit Location Patient: Home Provider: Virtual Visit Location Provider: Home Office   I discussed the limitations of evaluation and management by telemedicine and the availability of in person appointments. The patient expressed understanding and agreed to proceed.    History of Present Illness: Pam Clark is a 25 y.o. who identifies as a female who was assigned female at birth, and is being seen today for vaginal pain and discharge.  HPI: Vaginal Discharge The patient's primary symptoms include pelvic pain and vaginal discharge. The patient's pertinent negatives include no genital itching, genital lesions,  genital odor, genital rash or vaginal bleeding. This is a new problem. The current episode started yesterday. The problem occurs constantly. The problem has been unchanged. The pain is mild. The problem affects both sides. She is not pregnant. Associated symptoms include dysuria and painful intercourse. The vaginal discharge was white, thick and milky. She is sexually active. Contraceptive use: nexplanon.    She reports that last night she was getting ready to have vaginal intercourse, and when he inserted she had terrible pain. She tried to push through the pain, but it became so intense she had to stop. She went to the restroom and urinated, but reports that was very painful as well. She has had an increase in vaginal discharge, as noted above. She is still having vaginal pain constantly. Using the bathroom makes it more intense.   Problems:  Patient Active Problem List   Diagnosis Date Noted   S/P lobectomy of lung 01/29/2020   Bronchiectasis without complication (HCC) 10/09/2019   Nexplanon insertion 08/25/2018    Allergies:  Allergies  Allergen Reactions   Doxycycline Nausea And Vomiting   Medications:  Current Outpatient Medications:    metroNIDAZOLE (FLAGYL) 500 MG tablet, Take 1 tablet (500 mg total) by mouth 2 (two) times daily for 7 days., Disp: 14 tablet, Rfl: 0   azithromycin (ZITHROMAX Z-PAK) 250 MG tablet, Take two pills today followed by one a day until gone, Disp: 6 tablet, Rfl: 0   azithromycin (ZITHROMAX) 500 MG tablet, Take 2 po now, Disp: 2 tablet, Rfl: 0   CRANBERRY EXTRACT PO, Take by mouth., Disp: , Rfl:    FENUGREEK PO, Take by mouth., Disp: , Rfl:    Ferrous Sulfate (IRON PO), Take 1 tablet by mouth daily., Disp: , Rfl:    fluconazole (DIFLUCAN) 150 MG tablet, Take 1 now and 1 in 3 days, Disp: 2 tablet, Rfl: 1   norelgestromin-ethinyl estradiol (ORTHO EVRA) 150-35 MCG/24HR transdermal patch, Place 1 patch onto the skin once a week., Disp: 9 patch, Rfl: 4    ondansetron (ZOFRAN ODT) 4 MG disintegrating tablet, Take one tab by mouth Q6hr prn nausea, Disp: 12 tablet, Rfl: 0  Observations/Objective: Patient is well-developed, well-nourished in no acute distress.  Resting comfortably at home.  Head is normocephalic, atraumatic.  No labored breathing. Speech is clear and coherent with logical content.  Patient is alert and oriented at baseline.    Assessment and Plan: 1. BV (bacterial vaginosis) - metroNIDAZOLE (FLAGYL) 500 MG tablet; Take 1 tablet (500 mg total) by mouth 2 (two) times daily for 7 days.  Dispense: 14 tablet; Refill: 0  2. Vaginal pain - Will treat as suspected BV for now - Advised if not improving she needs to be seen in person for a vaginal exam, she voices understanding - Avoid intercourse while having pain and during treatment -  Seek urgent evaluation if symptoms worsen  Follow Up Instructions: I discussed the assessment and treatment plan with the patient. The patient was provided an opportunity to ask questions and all were answered. The patient agreed with the plan and demonstrated an understanding of the instructions.  A copy of instructions were sent to the patient via MyChart.  The patient was advised to call back or seek an in-person evaluation if the symptoms worsen or if the condition fails to improve as anticipated.  Time:  I spent 12 minutes with the patient via telehealth technology discussing the above problems/concerns.    [name], PA-C

## 2020-10-08 ENCOUNTER — Ambulatory Visit: Payer: Managed Care, Other (non HMO) | Admitting: Women's Health

## 2020-10-21 ENCOUNTER — Other Ambulatory Visit: Payer: Self-pay | Admitting: Thoracic Surgery (Cardiothoracic Vascular Surgery)

## 2020-10-21 DIAGNOSIS — Z902 Acquired absence of lung [part of]: Secondary | ICD-10-CM

## 2020-10-22 ENCOUNTER — Encounter: Payer: Managed Care, Other (non HMO) | Admitting: Thoracic Surgery (Cardiothoracic Vascular Surgery)

## 2020-10-23 ENCOUNTER — Encounter: Payer: Self-pay | Admitting: Thoracic Surgery (Cardiothoracic Vascular Surgery)

## 2020-10-29 ENCOUNTER — Ambulatory Visit: Payer: Self-pay | Admitting: Women's Health

## 2020-11-15 ENCOUNTER — Telehealth: Payer: Self-pay | Admitting: Physician Assistant

## 2020-11-15 ENCOUNTER — Encounter: Payer: Self-pay | Admitting: Physician Assistant

## 2020-11-15 DIAGNOSIS — R112 Nausea with vomiting, unspecified: Secondary | ICD-10-CM

## 2020-11-15 DIAGNOSIS — R197 Diarrhea, unspecified: Secondary | ICD-10-CM

## 2020-11-15 MED ORDER — ONDANSETRON 4 MG PO TBDP: ORAL_TABLET | ORAL | 0 refills | Status: DC

## 2020-11-15 MED ORDER — LOPERAMIDE HCL 2 MG PO CAPS
2.0000 mg | ORAL_CAPSULE | Freq: Four times a day (QID) | ORAL | 0 refills | Status: DC | PRN
Start: 2020-11-15 — End: 2021-02-04

## 2020-11-15 MED ORDER — AZITHROMYCIN 500 MG PO TABS: 500.0000 mg | ORAL_TABLET | Freq: Every day | ORAL | 0 refills | Status: AC

## 2020-11-15 NOTE — Patient Instructions (Signed)
1. Diarrhea of presumed infectious origin  - imodium  - start azithromycin if no improvement in 2-3 days or if bloody diarrhea worsens  - hydrate  2. Non-intractable vomiting with nausea, unspecified vomiting type  - zofran for vomiting and nausea  - continue hydration  - f/u with Urgent Care if symptoms worsen

## 2020-11-15 NOTE — Progress Notes (Signed)
[name], schum are scheduled for a virtual visit with your provider today.    Just as we do with appointments in the office, we must obtain your consent to participate.  Your consent will be active for this visit and any virtual visit you may have with one of our providers in the next 365 days.    If you have a MyChart account, I can also send a copy of this consent to you electronically.  All virtual visits are billed to your insurance company just like a traditional visit in the office.  As this is a virtual visit, video technology does not allow for your provider to perform a traditional examination.  This may limit your provider's ability to fully assess your condition.  If your provider identifies any concerns that need to be evaluated in person or the need to arrange testing such as labs, EKG, etc, we will make arrangements to do so.    Although advances in technology are sophisticated, we cannot ensure that it will always work on either your end or our end.  If the connection with a video visit is poor, we may have to switch to a telephone visit.  With either a video or telephone visit, we are not always able to ensure that we have a secure connection.   I need to obtain your verbal consent now.   Are you willing to proceed with your visit today?   Pam Clark has provided verbal consent on 11/15/2020 for a virtual visit (video or telephone).   Dierdre Forth, PA-C 11/15/2020  9:52 AM   Date:  11/15/2020   ID:  Pam Clark, DOB 1995/08/01, MRN [id number]  Patient Location: Home Provider Location: Home Office   Participants: Patient and Provider for Visit and Wrap up  Method of visit: Video  Location of Patient: Home Location of Provider: Home Office Consent was obtain for visit over the video. Services rendered by provider: Visit was performed via video  A video enabled telemedicine application was used and I verified that I am speaking with the correct  person using two identifiers.  [facility]P:  [facility]   Chief Complaint:  nausea and vomiting  History of Present Illness:    Pam Clark is a 25 y.o. female with history as stated below. Presents video telehealth for an acute care visit.  Pt reports yesterday she had several episodes of NBNB emesis and nausea.  Pt reports she vomited again this morning.  Pt reports she had several episodes of loose stool as well.    Pt denies recent travel.  Pt reports she ate fish stuffed chicken 12-24 hours before getting sick.  Several others got sick as well. No fever, chills. Pt reports she had a small amount of blood when she wipes, but this is not grossly bloody.  Pt reports this was brown like the start of her period as opposed to bright red or with mucous.   Pt reports intermittent cramping, but no persistent or focal abdominal pain.   Modifying factors include: none   No other aggravating or relieving factors.  No other c/o.  Past Medical, Surgical, Social History, Allergies, and Medications have been Reviewed.  Patient Active Problem List   Diagnosis Date Noted   S/P lobectomy of lung 01/29/2020   Bronchiectasis without complication (HCC) 10/09/2019   Nexplanon insertion 08/25/2018    Social History   Tobacco Use   Smoking status: Never   Smokeless tobacco: Never  Substance  Use Topics   Alcohol use: No     Current Outpatient Medications:    azithromycin (ZITHROMAX) 500 MG tablet, Take 1 tablet (500 mg total) by mouth daily for 3 days., Disp: 3 tablet, Rfl: 0   loperamide (IMODIUM) 2 MG capsule, Take 1 capsule (2 mg total) by mouth 4 (four) times daily as needed for diarrhea or loose stools., Disp: 12 capsule, Rfl: 0   ondansetron (ZOFRAN ODT) 4 MG disintegrating tablet, 4mg  ODT q4 hours prn nausea/vomit, Disp: 10 tablet, Rfl: 0   CRANBERRY EXTRACT PO, Take by mouth., Disp: , Rfl:    FENUGREEK PO, Take by mouth., Disp: , Rfl:    Ferrous Sulfate (IRON  PO), Take 1 tablet by mouth daily., Disp: , Rfl:    fluconazole (DIFLUCAN) 150 MG tablet, Take 1 now and 1 in 3 days, Disp: 2 tablet, Rfl: 1   norelgestromin-ethinyl estradiol (ORTHO EVRA) 150-35 MCG/24HR transdermal patch, Place 1 patch onto the skin once a week., Disp: 9 patch, Rfl: 4   Allergies  Allergen Reactions   Doxycycline Nausea And Vomiting     Review of Systems  Constitutional:  Negative for chills and fever.  HENT:  Negative for congestion, ear pain and sore throat.   Eyes:  Negative for blurred vision and double vision.  Respiratory:  Negative for cough, shortness of breath and wheezing.   Cardiovascular:  Negative for chest pain, palpitations and leg swelling.  Gastrointestinal:  Positive for abdominal pain (cramping), diarrhea, nausea and vomiting.  Genitourinary:  Negative for dysuria.  Musculoskeletal:  Negative for myalgias.  Skin:  Negative for rash.  Neurological:  Negative for loss of consciousness, weakness and headaches.  Psychiatric/Behavioral:  The patient is not nervous/anxious.   See HPI for history of present illness.  Physical Exam Constitutional:      General: She is not in acute distress.    Appearance: Normal appearance. She is not ill-appearing.  HENT:     Head: Normocephalic and atraumatic.     Nose: No congestion.     Mouth/Throat:     Mouth: Mucous membranes are moist.     Comments: Lips are not dry/cracked Eyes:     Extraocular Movements: Extraocular movements intact.  Pulmonary:     Effort: Pulmonary effort is normal.     Comments: Speaks in full sentences Musculoskeletal:        General: Normal range of motion.     Cervical back: Normal range of motion.  Skin:    Coloration: Skin is not pale.  Neurological:     General: No focal deficit present.     Mental Status: She is alert. Mental status is at baseline.  Psychiatric:        Mood and Affect: Mood normal.              A&P  1. Diarrhea of presumed infectious origin  -  imodium  - start azithromycin if no improvement in 2-3 days or if bloody diarrhea worsens  - hydrate  2. Non-intractable vomiting with nausea, unspecified vomiting type  - zofran for vomiting and nausea  - continue hydration  - f/u with Urgent Care if symptoms worsen   Patient voiced understanding and agreement to plan.   Time:   Today, I have spent 15 minutes with the patient with telehealth technology discussing the above problems, reviewing the chart, previous notes, medications and orders.    Tests Ordered: No orders of the defined types were placed in this encounter.  Medication Changes: Meds ordered this encounter  Medications   ondansetron (ZOFRAN ODT) 4 MG disintegrating tablet    Sig: 4mg  ODT q4 hours prn nausea/vomit    Dispense:  10 tablet    Refill:  0   loperamide (IMODIUM) 2 MG capsule    Sig: Take 1 capsule (2 mg total) by mouth 4 (four) times daily as needed for diarrhea or loose stools.    Dispense:  12 capsule    Refill:  0   azithromycin (ZITHROMAX) 500 MG tablet    Sig: Take 1 tablet (500 mg total) by mouth daily for 3 days.    Dispense:  3 tablet    Refill:  0     Disposition:  Follow up Urgent Care for worsening symptoms  Signed, , PA-C  11/15/2020 10:04 AM

## 2021-01-21 ENCOUNTER — Telehealth: Payer: Self-pay | Admitting: Emergency Medicine

## 2021-01-21 ENCOUNTER — Encounter: Payer: Self-pay | Admitting: Physician Assistant

## 2021-01-21 DIAGNOSIS — J069 Acute upper respiratory infection, unspecified: Secondary | ICD-10-CM

## 2021-01-21 NOTE — Patient Instructions (Signed)
  Pam Clark, thank you for joining Cathlyn Parsons, NP for today's virtual visit.  While this provider is not your primary care provider (PCP), if your PCP is located in our provider database this encounter information will be shared with them immediately following your visit.  Consent: (Patient) Pam Clark provided verbal consent for this virtual visit at the beginning of the encounter.  Current Medications:  Current Outpatient Medications:    CRANBERRY EXTRACT PO, Take by mouth., Disp: , Rfl:    FENUGREEK PO, Take by mouth., Disp: , Rfl:    Ferrous Sulfate (IRON PO), Take 1 tablet by mouth daily., Disp: , Rfl:    fluconazole (DIFLUCAN) 150 MG tablet, Take 1 now and 1 in 3 days, Disp: 2 tablet, Rfl: 1   loperamide (IMODIUM) 2 MG capsule, Take 1 capsule (2 mg total) by mouth 4 (four) times daily as needed for diarrhea or loose stools., Disp: 12 capsule, Rfl: 0   norelgestromin-ethinyl estradiol (ORTHO EVRA) 150-35 MCG/24HR transdermal patch, Place 1 patch onto the skin once a week., Disp: 9 patch, Rfl: 4   ondansetron (ZOFRAN ODT) 4 MG disintegrating tablet, 4mg  ODT q4 hours prn nausea/vomit, Disp: 10 tablet, Rfl: 0   Medications ordered in this encounter:  No orders of the defined types were placed in this encounter.    *If you need refills on other medications prior to your next appointment, please contact your pharmacy*  Follow-Up: Call back or seek an in-person evaluation if the symptoms worsen or if the condition fails to improve as anticipated.  Other Instructions Continue to use your incentive spirometer frequently as needed. Continue using Mucinex and drinking lots of liquids to help thin out and drain your congestion. If you develop shortness of breath, wheezing, or you feel your incentive spirometer is not working as well as it used to, I think you need to be seen in person to have your breathing evaluated.  If you click on the link below you will find a  list of [facility] urgent care locations where you can be seen.   If you have been instructed to have an in-person evaluation today at a local Urgent Care facility, please use the link below. It will take you to a list of all of our available [facility], including address, phone number and hours of operation. Please do not delay care.  [facility]  If you or a family member do not have a primary care provider, use the link below to schedule a visit and establish care. When you choose a [facility] primary care physician or advanced practice provider, you gain a long-term partner in health. Find a Primary Care Provider  Learn more about [facility] in-office and virtual care options: [facility] Get Care Now

## 2021-01-21 NOTE — Progress Notes (Addendum)
Virtual Visit Consent   Pam Clark, you are scheduled for a virtual visit with a  provider today.     Just as with appointments in the office, your consent must be obtained to participate.  Your consent will be active for this visit and any virtual visit you may have with one of our providers in the next 365 days.     If you have a MyChart account, a copy of this consent can be sent to you electronically.  All virtual visits are billed to your insurance company just like a traditional visit in the office.    As this is a virtual visit, video technology does not allow for your provider to perform a traditional examination.  This may limit your provider's ability to fully assess your condition.  If your provider identifies any concerns that need to be evaluated in person or the need to arrange testing (such as labs, EKG, etc.), we will make arrangements to do so.     Although advances in technology are sophisticated, we cannot ensure that it will always work on either your end or our end.  If the connection with a video visit is poor, the visit may have to be switched to a telephone visit.  With either a video or telephone visit, we are not always able to ensure that we have a secure connection.     I need to obtain your verbal consent now.   Are you willing to proceed with your visit today?    Pam Clark has provided verbal consent on 01/21/2021 for a virtual visit (video or telephone).   Pam Parsons, NP   Date: 01/21/2021 10:33 AM   Virtual Visit via Video Note   I, Pam Clark, connected with  Pam Clark  ([id number], Aug 11, 1995) on 01/21/21 at 10:15 AM EDT by a video-enabled telemedicine application and verified that I am speaking with the correct person using two identifiers.  Location: Patient: Virtual Visit Location Patient: Home Provider: Virtual Visit Location Provider: Home Office   I discussed the limitations of evaluation and  management by telemedicine and the availability of in person appointments. The patient expressed understanding and agreed to proceed.    History of Present Illness: Pam Clark is a 25 y.o. who identifies as a female who was assigned female at birth, and is being seen today for congestion and cough.  Patient had COVID about 3 to 4 weeks ago and recovered without issue.  This illness does not feel like COVID, she feels like she has a cold.  She started feeling ill yesterday.  She has a history of lung resection due to bronchiectasis and she has an incentive spirometer that she uses frequently at home.  She has been using her incentive spirometer frequently to prevent feeling short of breath.  Does not feel like she has shortness of breath or wheezing at this time.  Had a mild sore throat yesterday that was relieved by cough drops.  Had a temperature last night of 100 F that resolved after she took a bath and put onions in her socks.  This morning her temperature is normal.  She used Mucinex yesterday that helped break up her congestion.  She reports nasal congestion and nonproductive cough are worst complaints.  She requests to know if it is okay that she go to work today.  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  HPI: HPI  Problems:  Patient Active Problem List   Diagnosis Date Noted   S/P lobectomy of lung 01/29/2020   Bronchiectasis without complication (HCC) 10/09/2019   Nexplanon insertion 08/25/2018    Allergies:  Allergies  Allergen Reactions   Doxycycline Nausea And Vomiting   Medications:  Current Outpatient Medications:    CRANBERRY EXTRACT PO, Take by mouth., Disp: , Rfl:    FENUGREEK PO, Take by mouth., Disp: , Rfl:    Ferrous Sulfate (IRON PO), Take 1 tablet by mouth daily., Disp: , Rfl:    fluconazole (DIFLUCAN) 150 MG tablet, Take 1 now and 1 in 3 days, Disp: 2 tablet, Rfl: 1    loperamide (IMODIUM) 2 MG capsule, Take 1 capsule (2 mg total) by mouth 4 (four) times daily as needed for diarrhea or loose stools., Disp: 12 capsule, Rfl: 0   norelgestromin-ethinyl estradiol (ORTHO EVRA) 150-35 MCG/24HR transdermal patch, Place 1 patch onto the skin once a week., Disp: 9 patch, Rfl: 4   ondansetron (ZOFRAN ODT) 4 MG disintegrating tablet, 4mg  ODT q4 hours prn nausea/vomit, Disp: 10 tablet, Rfl: 0  Observations/Objective: Patient is well-developed, well-nourished in no acute distress.  Resting comfortably  at home.  Head is normocephalic, atraumatic.  No labored breathing. No cough during visit.  Speech is clear and coherent with logical content.  Patient is alert and oriented at baseline.    Assessment and Plan: 1. Viral upper respiratory tract infection  Advised patient she should stay at home until she is without a fever for 24 hours.  Given work note for today.  Reviewed supportive care measures including continuing to use Mucinex, drinking increased liquids, and using cough drops if needed.  Patient to continue using her incentive spirometer frequently and if she feels she is developing shortness of breath, wheezing or she is not able to perform her incentive spirometer to her usual level, she should be seen in person for a breathing evaluation.  Follow Up Instructions: I discussed the assessment and treatment plan with the patient. The patient was provided an opportunity to ask questions and all were answered. The patient agreed with the plan and demonstrated an understanding of the instructions.  A copy of instructions were sent to the patient via MyChart unless otherwise noted below.    The patient was advised to call back or seek an in-person evaluation if the symptoms worsen or if the condition fails to improve as anticipated.  Time:  I spent 13 minutes with the patient via telehealth technology discussing the above problems/concerns.    , NP

## 2021-01-28 ENCOUNTER — Telehealth: Payer: Self-pay | Admitting: Physician Assistant

## 2021-01-28 DIAGNOSIS — N898 Other specified noninflammatory disorders of vagina: Secondary | ICD-10-CM

## 2021-01-28 MED ORDER — MUPIROCIN 2 % EX OINT: 1.0000 "application " | TOPICAL_OINTMENT | Freq: Two times a day (BID) | CUTANEOUS | 0 refills | Status: DC

## 2021-01-28 NOTE — Patient Instructions (Signed)
Pam Clark, thank you for joining Mar Daring, PA-C for today's virtual visit.  While this provider is not your primary care provider (PCP), if your PCP is located in our provider database this encounter information will be shared with them immediately following your visit.  Consent: (Patient) Pam Clark provided verbal consent for this virtual visit at the beginning of the encounter.  Current Medications:  Current Outpatient Medications:    mupirocin ointment (BACTROBAN) 2 %, Apply 1 application topically 2 (two) times daily., Disp: 22 g, Rfl: 0   CRANBERRY EXTRACT PO, Take by mouth., Disp: , Rfl:    FENUGREEK PO, Take by mouth., Disp: , Rfl:    Ferrous Sulfate (IRON PO), Take 1 tablet by mouth daily., Disp: , Rfl:    loperamide (IMODIUM) 2 MG capsule, Take 1 capsule (2 mg total) by mouth 4 (four) times daily as needed for diarrhea or loose stools., Disp: 12 capsule, Rfl: 0   ondansetron (ZOFRAN ODT) 4 MG disintegrating tablet, 4mg  ODT q4 hours prn nausea/vomit, Disp: 10 tablet, Rfl: 0   Medications ordered in this encounter:  Meds ordered this encounter  Medications   mupirocin ointment (BACTROBAN) 2 %    Sig: Apply 1 application topically 2 (two) times daily.    Dispense:  22 g    Refill:  0    Order Specific Question:   Supervising Provider    Answer:   Sabra Heck, [place]     *If you need refills on other medications prior to your next appointment, please contact your pharmacy*  Follow-Up: Call back or seek an in-person evaluation if the symptoms worsen or if the condition fails to improve as anticipated.  Other Instructions Mupirocin Cream or Ointment What is this medication? MUPIROCIN (myoo PEER oh sin) treats skin infections caused by bacteria. It works by killing or preventing the growth of bacteria on the skin. This medicine may be used for other purposes; ask your health care provider or pharmacist if you have questions. COMMON BRAND  NAME(S): Bactroban, Centany, Centany AT What should I tell my care team before I take this medication? They need to know if you have any of these conditions: Kidney disease Large areas of burned or damaged skin Stomach or intestine problems such as colitis An unusual or allergic reaction to mupirocin, other medications, foods, dyes, or preservatives Pregnant or trying to get pregnant Breast-feeding How should I use this medication? This medication is for external use only. Do not take by mouth. Wash your hands before and after use. If you are treating a hand infection, only wash your hands before use. Do not get it in your eyes. If you do, rinse your eyes with plenty of cool tap water. Use it as directed on the prescription label at the same time every day. Do not use it more often than directed. Use the medication for the full course as directed by your care team, even if you think you are better. Do not stop using it unless your care team tells you to stop it early. Apply a thin film of the medication to the affected area. You can cover the area with a sterile gauze dressing (bandage). Do not use an airtight bandage (such as a plastic-covered bandage). Talk to your care team about the use of this medication in children. While it may be prescribed for children for selected conditions, precautions do apply. Overdosage: If you think you have taken too much of this medicine contact a poison  control center or emergency room at once. NOTE: This medicine is only for you. Do not share this medicine with others. What if I miss a dose? If you miss a dose, use it as soon as you can. If it is almost time for your next dose, use only that dose. Do not use double or extra doses. What may interact with this medication? Interactions are not expected. Do not use any other skin products on the affected area without telling your care team. This list may not describe all possible interactions. Give your health care  provider a list of all the medicines, herbs, non-prescription drugs, or dietary supplements you use. Also tell them if you smoke, drink alcohol, or use illegal drugs. Some items may interact with your medicine. What should I watch for while using this medication? Tell your care team if your symptoms do not start to get better or if they get worse. Do not treat diarrhea with over the counter products. Contact your care team if you have diarrhea that lasts more than 2 days or if it is severe and watery. What side effects may I notice from receiving this medication? Side effects that you should report to your care team as soon as possible: Allergic reactions-skin rash, itching, hives, swelling of the face, lips, tongue, or throat Burning, itching, crusting, or peeling of treated skin Severe diarrhea, fever Side effects that usually do not require medical attention (report to your care team if they continue or are bothersome): Mild skin irritation, redness, or dryness This list may not describe all possible side effects. Call your doctor for medical advice about side effects. You may report side effects to FDA at 1-800-FDA-1088. Where should I keep my medication? Keep out of the reach of children and pets. Store at room temperature between 20 and 25 degrees C (68 and 77 degrees F). Do not freeze. Get rid of any unused medication after the expiration date. NOTE: This sheet is a summary. It may not cover all possible information. If you have questions about this medicine, talk to your doctor, pharmacist, or health care provider.  2022 Elsevier/Gold Standard (2020-06-19 12:29:19)    If you have been instructed to have an in-person evaluation today at a local Urgent Care facility, please use the link below. It will take you to a list of all of our available [facility], including address, phone number and hours of operation. Please do not delay care.  [facility]  If you or  a family member do not have a primary care provider, use the link below to schedule a visit and establish care. When you choose a [facility] primary care physician or advanced practice provider, you gain a long-term partner in health. Find a Primary Care Provider  Learn more about [facility]'s in-office and virtual care options:  - Get Care Now

## 2021-01-28 NOTE — Progress Notes (Signed)
Virtual Visit Consent   Pam Clark, you are scheduled for a virtual visit with a [facility] provider today.     Just as with appointments in the office, your consent must be obtained to participate.  Your consent will be active for this visit and any virtual visit you may have with one of our providers in the next 365 days.     If you have a MyChart account, a copy of this consent can be sent to you electronically.  All virtual visits are billed to your insurance company just like a traditional visit in the office.    As this is a virtual visit, video technology does not allow for your provider to perform a traditional examination.  This may limit your provider's ability to fully assess your condition.  If your provider identifies any concerns that need to be evaluated in person or the need to arrange testing (such as labs, EKG, etc.), we will make arrangements to do so.     Although advances in technology are sophisticated, we cannot ensure that it will always work on either your end or our end.  If the connection with a video visit is poor, the visit may have to be switched to a telephone visit.  With either a video or telephone visit, we are not always able to ensure that we have a secure connection.     I need to obtain your verbal consent now.   Are you willing to proceed with your visit today?    [name] has provided verbal consent on 01/28/2021 for a virtual visit (video or telephone).   Mar Daring, PA-C   Date: 01/28/2021 4:33 PM   Virtual Visit via Video Note   I, Mar Daring, connected with  Pam Clark  ([id number], 1996-03-19) on 01/28/21 at  3:45 PM EDT by a video-enabled telemedicine application and verified that I am speaking with the correct person using two identifiers.  Location: Patient: Virtual Visit Location Patient: Home Provider: Virtual Visit Location Provider: Home Office   I discussed the limitations of  evaluation and management by telemedicine and the availability of in person appointments. The patient expressed understanding and agreed to proceed.    History of Present Illness: Pam Clark is a 25 y.o. who identifies as a female who was assigned female at birth, and is being seen today for vaginal sore.  HPI: Vaginal Pain The patient's primary symptoms include genital lesions (single lesion with scabbing over top, also reports a mucous type discharge from the lesion). The patient's pertinent negatives include no genital itching, genital odor, genital rash, pelvic pain, vaginal bleeding or vaginal discharge. This is a new problem. The current episode started in the past 7 days. Episode frequency: clothing aggravates it. The problem has been unchanged. The pain is mild. Pertinent negatives include no abdominal pain, back pain, chills, dysuria, fever, flank pain, frequency, hematuria, nausea, rash, urgency or vomiting. Exacerbated by: clothing. She has tried nothing for the symptoms.     Problems:  Patient Active Problem List   Diagnosis Date Noted   S/P lobectomy of lung 01/29/2020   Bronchiectasis without complication ([facility]) 0000000   Nexplanon insertion 08/25/2018    Allergies:  Allergies  Allergen Reactions   Doxycycline Nausea And Vomiting   Medications:  Current Outpatient Medications:    mupirocin ointment (BACTROBAN) 2 %, Apply 1 application topically 2 (two) times daily., Disp: 22 g, Rfl: 0   CRANBERRY EXTRACT PO, Take  by mouth., Disp: , Rfl:    FENUGREEK PO, Take by mouth., Disp: , Rfl:    Ferrous Sulfate (IRON PO), Take 1 tablet by mouth daily., Disp: , Rfl:    loperamide (IMODIUM) 2 MG capsule, Take 1 capsule (2 mg total) by mouth 4 (four) times daily as needed for diarrhea or loose stools., Disp: 12 capsule, Rfl: 0   ondansetron (ZOFRAN ODT) 4 MG disintegrating tablet, 4mg  ODT q4 hours prn nausea/vomit, Disp: 10 tablet, Rfl: 0  Observations/Objective: Patient  is well-developed, well-nourished in no acute distress.  Resting comfortably at home.  Head is normocephalic, atraumatic.  No labored breathing.  Speech is clear and coherent with logical content.  Patient is alert and oriented at baseline.    Assessment and Plan: 1. Vaginal sore - mupirocin ointment (BACTROBAN) 2 %; Apply 1 application topically 2 (two) times daily.  Dispense: 22 g; Refill: 0  - Advised that we are very limited with treatment via video exam - Will try Mupirocin ointment and epsom salt soaks (Sitz baths) - If not improving in 7-10 days please be seen in person for further evaluation and testing  Follow Up Instructions: I discussed the assessment and treatment plan with the patient. The patient was provided an opportunity to ask questions and all were answered. The patient agreed with the plan and demonstrated an understanding of the instructions.  A copy of instructions were sent to the patient via MyChart unless otherwise noted below.    The patient was advised to call back or seek an in-person evaluation if the symptoms worsen or if the condition fails to improve as anticipated.  Time:  I spent 10 minutes with the patient via telehealth technology discussing the above problems/concerns.    9-10, PA-C

## 2021-02-04 ENCOUNTER — Telehealth: Payer: Self-pay | Admitting: Physician Assistant

## 2021-02-04 DIAGNOSIS — B9689 Other specified bacterial agents as the cause of diseases classified elsewhere: Secondary | ICD-10-CM

## 2021-02-04 DIAGNOSIS — J208 Acute bronchitis due to other specified organisms: Secondary | ICD-10-CM

## 2021-02-04 MED ORDER — BENZONATATE 100 MG PO CAPS: 100.0000 mg | ORAL_CAPSULE | Freq: Three times a day (TID) | ORAL | 0 refills | Status: DC | PRN

## 2021-02-04 MED ORDER — AZITHROMYCIN 250 MG PO TABS
ORAL_TABLET | ORAL | 0 refills | Status: AC
Start: 2021-02-04 — End: 2021-02-09

## 2021-02-04 NOTE — Patient Instructions (Signed)
Pam Clark, thank you for joining Piedad Climes, PA-C for today's virtual visit.  While this provider is not your primary care provider (PCP), if your PCP is located in our provider database this encounter information will be shared with them immediately following your visit.  Consent: (Patient) Pam Clark provided verbal consent for this virtual visit at the beginning of the encounter.  Current Medications:  Current Outpatient Medications:    CRANBERRY EXTRACT PO, Take by mouth., Disp: , Rfl:    FENUGREEK PO, Take by mouth., Disp: , Rfl:    Ferrous Sulfate (IRON PO), Take 1 tablet by mouth daily., Disp: , Rfl:    loperamide (IMODIUM) 2 MG capsule, Take 1 capsule (2 mg total) by mouth 4 (four) times daily as needed for diarrhea or loose stools., Disp: 12 capsule, Rfl: 0   mupirocin ointment (BACTROBAN) 2 %, Apply 1 application topically 2 (two) times daily., Disp: 22 g, Rfl: 0   ondansetron (ZOFRAN ODT) 4 MG disintegrating tablet, 4mg  ODT q4 hours prn nausea/vomit, Disp: 10 tablet, Rfl: 0   Medications ordered in this encounter:  No orders of the defined types were placed in this encounter.    *If you need refills on other medications prior to your next appointment, please contact your pharmacy*  Follow-Up: Call back or seek an in-person evaluation if the symptoms worsen or if the condition fails to improve as anticipated.  Other Instructions Take antibiotic (Azithromycin) as directed.  Increase fluids.  Get plenty of rest. Use Mucinex for congestion. Use the Tessalon for cough. Take a daily probiotic (I recommend Align or Culturelle, but even Activia Yogurt may be beneficial).  A humidifier placed in the bedroom may offer some relief for a dry, scratchy throat of nasal irritation.  Read information below on acute bronchitis. Please call or return to clinic if symptoms are not improving.  Acute Bronchitis Bronchitis is when the airways that extend from the  windpipe into the lungs get red, puffy, and painful (inflamed). Bronchitis often causes thick spit (mucus) to develop. This leads to a cough. A cough is the most common symptom of bronchitis. In acute bronchitis, the condition usually begins suddenly and goes away over time (usually in 2 weeks). Smoking, allergies, and asthma can make bronchitis worse. Repeated episodes of bronchitis may cause more lung problems.  HOME CARE Rest. Drink enough fluids to keep your pee (urine) clear or pale yellow (unless you need to limit fluids as told by your doctor). Only take over-the-counter or prescription medicines as told by your doctor. Avoid smoking and secondhand smoke. These can make bronchitis worse. If you are a smoker, think about using nicotine gum or skin patches. Quitting smoking will help your lungs heal faster. Reduce the chance of getting bronchitis again by: Washing your hands often. Avoiding people with cold symptoms. Trying not to touch your hands to your mouth, nose, or eyes. Follow up with your doctor as told.  GET HELP IF: Your symptoms do not improve after 1 week of treatment. Symptoms include: Cough. Fever. Coughing up thick spit. Body aches. Chest congestion. Chills. Shortness of breath. Sore throat.  GET HELP RIGHT AWAY IF:  You have an increased fever. You have chills. You have severe shortness of breath. You have bloody thick spit (sputum). You throw up (vomit) often. You lose too much body fluid (dehydration). You have a severe headache. You faint.  MAKE SURE YOU:  Understand these instructions. Will watch your condition. Will get help right  away if you are not doing well or get worse. Document Released: 09/02/2007 Document Revised: 11/16/2012 Document Reviewed: 09/06/2012 [facility] Patient Information 2015 [place], [place]. This information is not intended to replace advice given to you by your health care provider. Make sure you discuss any questions you have  with your health care provider.    If you have been instructed to have an in-person evaluation today at a local Urgent Care facility, please use the link below. It will take you to a list of all of our available [facility], including address, phone number and hours of operation. Please do not delay care.  [facility]  If you or a family member do not have a primary care provider, use the link below to schedule a visit and establish care. When you choose a [facility] primary care physician or advanced practice provider, you gain a long-term partner in health. Find a Primary Care Provider  Learn more about [facility] in-office and virtual care options: [facility] - Get Care Now

## 2021-02-04 NOTE — Progress Notes (Signed)
Virtual Visit Consent   [name], you are scheduled for a virtual visit with a [facility] provider today.     Just as with appointments in the office, your consent must be obtained to participate.  Your consent will be active for this visit and any virtual visit you may have with one of our providers in the next 365 days.     If you have a MyChart account, a copy of this consent can be sent to you electronically.  All virtual visits are billed to your insurance company just like a traditional visit in the office.    As this is a virtual visit, video technology does not allow for your provider to perform a traditional examination.  This may limit your provider's ability to fully assess your condition.  If your provider identifies any concerns that need to be evaluated in person or the need to arrange testing (such as labs, EKG, etc.), we will make arrangements to do so.     Although advances in technology are sophisticated, we cannot ensure that it will always work on either your end or our end.  If the connection with a video visit is poor, the visit may have to be switched to a telephone visit.  With either a video or telephone visit, we are not always able to ensure that we have a secure connection.     I need to obtain your verbal consent now.   Are you willing to proceed with your visit today?    Kristian Hazzard has provided verbal consent on 02/04/2021 for a virtual visit (video or telephone).   Pam Clark, [place]   Date: 02/04/2021 8:12 AM   Virtual Visit via Video Note   I, Pam Clark, connected with  Britain Anagnos  ([id number], Nov 08, 1995) on 02/04/21 at  8:00 AM EST by a video-enabled telemedicine application and verified that I am speaking with the correct person using two identifiers.  Location: Patient: Virtual Visit Location Patient: Home Provider: Virtual Visit Location Provider: Home Office   I discussed the limitations of  evaluation and management by telemedicine and the availability of in person appointments. The patient expressed understanding and agreed to proceed.    History of Present Illness: Pam Clark is a 25 y.o. who identifies as a female who was assigned female at birth, and is being seen today for 1 week of progressively worsening URI symptoms -- sore throat and inflammation with swollen tonsils. Also significant chest congestion and cough that is now dark green. Denies fever, chills, aches. Has had strep throat 2 x in the past year. Has taken OTC Mucinex Day and Night.   HPI: HPI  Problems:  Patient Active Problem List   Diagnosis Date Noted   S/P lobectomy of lung 01/29/2020   Bronchiectasis without complication ([facility]) 10/09/2019   Nexplanon insertion 08/25/2018    Allergies:  Allergies  Allergen Reactions   Doxycycline Nausea And Vomiting   Medications:  Current Outpatient Medications:    azithromycin (ZITHROMAX) 250 MG tablet, Take 2 tablets on day 1, then 1 tablet daily on days 2 through 5, Disp: 6 tablet, Rfl: 0   benzonatate (TESSALON) 100 MG capsule, Take 1 capsule (100 mg total) by mouth 3 (three) times daily as needed for cough., Disp: 30 capsule, Rfl: 0   CRANBERRY EXTRACT PO, Take by mouth., Disp: , Rfl:    FENUGREEK PO, Take by mouth., Disp: , Rfl:    Ferrous Sulfate (IRON  PO), Take 1 tablet by mouth daily., Disp: , Rfl:   Observations/Objective: Patient is well-developed, well-nourished in no acute distress.  Resting comfortably on bed at home.  Head is normocephalic, atraumatic.  No labored breathing. Speech is clear and coherent with logical content.  Patient is alert and oriented at baseline.   Assessment and Plan: 1. Acute bacterial bronchitis - benzonatate (TESSALON) 100 MG capsule; Take 1 capsule (100 mg total) by mouth 3 (three) times daily as needed for cough.  Dispense: 30 capsule; Refill: 0 - azithromycin (ZITHROMAX) 250 MG tablet; Take 2 tablets on  day 1, then 1 tablet daily on days 2 through 5  Dispense: 6 tablet; Refill: 0 Rx Azithromycin.  Increase fluids.  Rest.  Saline nasal spray.  Probiotic.  Mucinex as directed.  Humidifier in bedroom. Tessalon per orders.  Call or return to clinic if symptoms are not improving.   Follow Up Instructions: I discussed the assessment and treatment plan with the patient. The patient was provided an opportunity to ask questions and all were answered. The patient agreed with the plan and demonstrated an understanding of the instructions.  A copy of instructions were sent to the patient via MyChart unless otherwise noted below.   The patient was advised to call back or seek an in-person evaluation if the symptoms worsen or if the condition fails to improve as anticipated.  Time:  I spent 12 minutes with the patient via telehealth technology discussing the above problems/concerns.    Pam Climes, PA-C

## 2021-02-10 ENCOUNTER — Telehealth: Payer: Self-pay | Admitting: Nurse Practitioner

## 2021-02-10 DIAGNOSIS — J40 Bronchitis, not specified as acute or chronic: Secondary | ICD-10-CM

## 2021-02-10 DIAGNOSIS — J029 Acute pharyngitis, unspecified: Secondary | ICD-10-CM

## 2021-02-10 MED ORDER — AMOXICILLIN-POT CLAVULANATE 875-125 MG PO TABS: 1.0000 | ORAL_TABLET | Freq: Two times a day (BID) | ORAL | 0 refills | Status: AC

## 2021-02-10 MED ORDER — ALBUTEROL SULFATE HFA 108 (90 BASE) MCG/ACT IN AERS: 2.0000 | INHALATION_SPRAY | Freq: Four times a day (QID) | RESPIRATORY_TRACT | 0 refills | Status: DC | PRN

## 2021-02-10 NOTE — Progress Notes (Signed)
Virtual Visit Consent   Pam Clark, you are scheduled for a virtual visit with a [facility] provider today.     Just as with appointments in the office, your consent must be obtained to participate.  Your consent will be active for this visit and any virtual visit you may have with one of our providers in the next 365 days.     If you have a MyChart account, a copy of this consent can be sent to you electronically.  All virtual visits are billed to your insurance company just like a traditional visit in the office.    As this is a virtual visit, video technology does not allow for your provider to perform a traditional examination.  This may limit your provider's ability to fully assess your condition.  If your provider identifies any concerns that need to be evaluated in person or the need to arrange testing (such as labs, EKG, etc.), we will make arrangements to do so.     Although advances in technology are sophisticated, we cannot ensure that it will always work on either your end or our end.  If the connection with a video visit is poor, the visit may have to be switched to a telephone visit.  With either a video or telephone visit, we are not always able to ensure that we have a secure connection.     I need to obtain your verbal consent now.   Are you willing to proceed with your visit today?    Brad Mcgaughy has provided verbal consent on 02/10/2021 for a virtual visit (video or telephone).   Viviano Simas, FNP   Date: 02/10/2021 3:18 PM   Virtual Visit via Video Note   I, Viviano Simas, connected with  Pam Clark  ([id number], [birth year]) on 02/10/21 at  3:15 PM EST by a video-enabled telemedicine application and verified that I am speaking with the correct person using two identifiers.  Location: Patient: at work [place]  Provider: Virtual Visit Location Provider: Home Office   I discussed the limitations of evaluation and management by telemedicine and  the availability of in person appointments. The patient expressed understanding and agreed to proceed.    History of Present Illness:  Pam Clark is a 25 y.o. who identifies as a female who was assigned female at birth, and is being seen today for complaints of a sore throat without fever.   Her sore throat started 5 hours ago. She felt OK when she woke up this morning.  She has had a productive cough for the past two weeks with nasal congestion, she was on azithromycin up until 2 days ago for her cough with some improvement  Her cough is productive and she feels she has post nasal drainage as well   Denies known sick contacts. Denies fever  She has been drinking hot tea all day without relief.  She is currently at work at a nursing home.   She denies a history of mono.   She has had COVID in the past earlier this year, she has also taken a COVID test and that was negative.   She does feel that her cough is causing her to wheeze now, she has used an inhaler in the past and has a history of a lobectomy.   Problems:  Patient Active Problem List   Diagnosis Date Noted   S/P lobectomy of lung 01/29/2020   Bronchiectasis without complication (HCC) 10/09/2019   Nexplanon insertion  08/25/2018    Allergies:  Allergies  Allergen Reactions   Doxycycline Nausea And Vomiting   Medications:  Current Outpatient Medications:    benzonatate (TESSALON) 100 MG capsule, Take 1 capsule (100 mg total) by mouth 3 (three) times daily as needed for cough., Disp: 30 capsule, Rfl: 0   CRANBERRY EXTRACT PO, Take by mouth., Disp: , Rfl:    FENUGREEK PO, Take by mouth., Disp: , Rfl:    Ferrous Sulfate (IRON PO), Take 1 tablet by mouth daily., Disp: , Rfl:   Observations/Objective: Patient is well-developed, well-nourished in no acute distress.  Resting comfortably at work Head is normocephalic, atraumatic.  No labored breathing.  Speech is clear and coherent with logical content.   Patient is alert and oriented at baseline.    Assessment and Plan: 1. Pharyngitis, unspecified etiology Unresolved sinusitis causing sore throat symptoms   - amoxicillin-clavulanate (AUGMENTIN) 875-125 MG tablet; Take 1 tablet by mouth 2 (two) times daily for 7 days. Take with food  Dispense: 14 tablet; Refill: 0  Warm salt water gargles and OTC decongestant for added relief.  2. Bronchitis  - albuterol (VENTOLIN HFA) 108 (90 Base) MCG/ACT inhaler; Inhale 2 puffs into the lungs every 6 (six) hours as needed for wheezing or shortness of breath.  Dispense: 8 g; Refill: 0    If cough persists follow up with PCP for evaluation If ST persists follow up for mono testing as discussed  Follow Up Instructions: I discussed the assessment and treatment plan with the patient. The patient was provided an opportunity to ask questions and all were answered. The patient agreed with the plan and demonstrated an understanding of the instructions.  A copy of instructions were sent to the patient via MyChart unless otherwise noted below.     The patient was advised to call back or seek an in-person evaluation if the symptoms worsen or if the condition fails to improve as anticipated.  Time:  I spent 10 minutes with the patient via telehealth technology discussing the above problems/concerns.    Viviano Simas, FNP

## 2021-02-11 ENCOUNTER — Encounter (HOSPITAL_COMMUNITY): Payer: Self-pay

## 2021-02-11 ENCOUNTER — Emergency Department (HOSPITAL_COMMUNITY)
Admission: EM | Admit: 2021-02-11 | Discharge: 2021-02-11 | Disposition: A | Discharge status: Home / Self Care | Payer: Self-pay | Attending: Emergency Medicine | Admitting: Emergency Medicine

## 2021-02-11 ENCOUNTER — Other Ambulatory Visit: Payer: Self-pay

## 2021-02-11 DIAGNOSIS — Z96642 Presence of left artificial hip joint: Secondary | ICD-10-CM | POA: Insufficient documentation

## 2021-02-11 DIAGNOSIS — R000 Tachycardia, unspecified: Secondary | ICD-10-CM | POA: Insufficient documentation

## 2021-02-11 DIAGNOSIS — Z20822 Contact with and (suspected) exposure to covid-19: Secondary | ICD-10-CM | POA: Insufficient documentation

## 2021-02-11 DIAGNOSIS — J101 Influenza due to other identified influenza virus with other respiratory manifestations: Secondary | ICD-10-CM | POA: Insufficient documentation

## 2021-02-11 LAB — RESP PANEL BY RT-PCR (FLU A&B, COVID) ARPGX2
Influenza A by PCR: POSITIVE — AB
Influenza B by PCR: NEGATIVE
SARS Coronavirus 2 by RT PCR: NEGATIVE

## 2021-02-11 MED ORDER — IBUPROFEN 200 MG PO TABS
600.0000 mg | ORAL_TABLET | Freq: Once | ORAL | Status: AC
  Administered 2021-02-11: 600 mg via ORAL
  Filled 2021-02-11: qty 3
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
## 2021-02-11 NOTE — ED Triage Notes (Signed)
Pt states that she hasn't been feeling well x 2 weeks. Pt states that today she has been weaker than normal. Pt reports with chills, sore throat, and weakness.

## 2021-02-11 NOTE — Discharge Instructions (Addendum)
You tested positive for influenza A in the emergency department.  Take tylenol for fever and ibuprofen/Motrin for headaches, body aches, sore throat. Drink plenty of fluids to prevent dehydration.  You may use 2 puffs of an albuterol inhaler every 4-6 hours for cough, wheezing, chest tightness.  Continue to use other over-the-counter remedies for symptom control, if desired. Return for new or concerning symptoms.

## 2021-02-24 ENCOUNTER — Encounter: Payer: Self-pay | Admitting: Adult Health

## 2021-02-28 ENCOUNTER — Telehealth: Payer: Self-pay | Admitting: Nurse Practitioner

## 2021-02-28 DIAGNOSIS — Z202 Contact with and (suspected) exposure to infections with a predominantly sexual mode of transmission: Secondary | ICD-10-CM

## 2021-02-28 MED ORDER — AZITHROMYCIN 250 MG PO TABS: 1000.0000 mg | ORAL_TABLET | Freq: Once | ORAL | 0 refills | Status: AC

## 2021-02-28 MED ORDER — AZITHROMYCIN 1 G PO PACK: 4.0000 g | PACK | Freq: Once | ORAL | 0 refills | Status: DC

## 2021-02-28 NOTE — Progress Notes (Addendum)
Virtual Visit Consent   Pam Clark, you are scheduled for a virtual visit with [name], FNP, a [facility] provider, today.     Just as with appointments in the office, your consent must be obtained to participate.  Your consent will be active for this visit and any virtual visit you may have with one of our providers in the next 365 days.     If you have a MyChart account, a copy of this consent can be sent to you electronically.  All virtual visits are billed to your insurance company just like a traditional visit in the office.    As this is a virtual visit, video technology does not allow for your provider to perform a traditional examination.  This may limit your provider's ability to fully assess your condition.  If your provider identifies any concerns that need to be evaluated in person or the need to arrange testing (such as labs, EKG, etc.), we will make arrangements to do so.     Although advances in technology are sophisticated, we cannot ensure that it will always work on either your end or our end.  If the connection with a video visit is poor, the visit may have to be switched to a telephone visit.  With either a video or telephone visit, we are not always able to ensure that we have a secure connection.     I need to obtain your verbal consent now.   Are you willing to proceed with your visit today? YES   Pam Clark has provided verbal consent on 02/28/2021 for a virtual visit (video or telephone).   [name], FNP   Date: 02/28/2021 7:35 PM   Virtual Visit via Video Note   I, [name], connected with Pam Clark ([id number], [birth year]) on 02/28/21 at  7:45 PM EST by a video-enabled telemedicine application and verified that I am speaking with the correct person using two identifiers.  Location: Patient: Virtual Visit Location Patient: Home Provider: Virtual Visit Location Provider: Mobile   I  discussed the limitations of evaluation and management by telemedicine and the availability of in person appointments. The patient expressed understanding and agreed to proceed.    History of Present Illness: Pam Clark is a 25 y.o. who identifies as a female who was assigned female at birth, and is being seen today for chlamydia.  HPI: Patient states that her sexual partner tested positive for chlamydia.she has no symptoms and doe snot know what to do.   Review of Systems  Constitutional: Negative.   HENT: Negative.    Respiratory: Negative.    Cardiovascular: Negative.   Musculoskeletal: Negative.   Skin: Negative.   Neurological: Negative.   Psychiatric/Behavioral: Negative.    All other systems reviewed and are negative.  Problems:  Patient Active Problem List   Diagnosis Date Noted   S/P lobectomy of lung 01/29/2020   Bronchiectasis without complication (HCC) 10/09/2019   Nexplanon insertion 08/25/2018    Allergies:  Allergies  Allergen Reactions   Doxycycline Nausea And Vomiting   Medications:  Current Outpatient Medications:    albuterol (VENTOLIN HFA) 108 (90 Base) MCG/ACT inhaler, Inhale 2 puffs into the lungs every 6 (six) hours as needed for wheezing or shortness of breath., Disp: 8 g, Rfl: 0   benzonatate (TESSALON) 100 MG capsule, Take 1 capsule (100 mg total) by mouth 3 (three) times daily as needed for cough., Disp: 30 capsule, Rfl: 0   CRANBERRY  EXTRACT PO, Take by mouth., Disp: , Rfl:    FENUGREEK PO, Take by mouth., Disp: , Rfl:    Ferrous Sulfate (IRON PO), Take 1 tablet by mouth daily., Disp: , Rfl:   Observations/Objective: Patient is well-developed, well-nourished in no acute distress.  Resting comfortably at home.  Head is normocephalic, atraumatic.  No labored breathing.  Speech is clear and coherent with logical content.  Patient is alert and oriented at baseline.    Assessment and Plan:  Pam Clark in today with chief  complaint of No chief complaint on file.   1. Exposure to chlamydia No intercourse until finished antibiotics Safe sex discussed Meds ordered this encounter  Medications   DISCONTD: azithromycin (ZITHROMAX) 1 g powder    Sig: Take 4 packets (4 g total) by mouth once for 1 dose.    Dispense:  1 each    Refill:  0    Order Specific Question:   Supervising Provider    Answer:   Hyacinth Meeker, BRIAN [[id number]]   azithromycin (ZITHROMAX) 250 MG tablet    Sig: Take 4 tablets (1,000 mg total) by mouth once for 1 dose.    Dispense:  6 each    Refill:  0    Order Specific Question:   Supervising Provider    Answer:   Eber Hong [[id number]]        Follow Up Instructions: I discussed the assessment and treatment plan with the patient. The patient was provided an opportunity to ask questions and all were answered. The patient agreed with the plan and demonstrated an understanding of the instructions.  A copy of instructions were sent to the patient via MyChart.  The patient was advised to call back or seek an in-person evaluation if the symptoms worsen or if the condition fails to improve as anticipated.  Time:  I spent 10 minutes with the patient via telehealth technology discussing the above problems/concerns.    [name], FNP

## 2021-02-28 NOTE — Addendum Note (Signed)
Addended by: Bennie Pierini on: 02/28/2021 07:54 PM   Modules accepted: Orders

## 2021-02-28 NOTE — Patient Instructions (Signed)
Chlamydia, Female ?Chlamydia is an STI (sexually transmitted infection). This infection spreads through sexual contact. ?This condition is not hard to treat. But if it is not treated, it can cause worse health problems. You may have a higher risk of not being able to have children. Also, if you have untreated chlamydia and you are pregnant or get pregnant: ?You can spread the infection to your baby during delivery. ?Your baby may have health problems if he or she gets infected. ?What are the causes? ?This condition is caused by germs (bacteria) called Chlamydia trachomatis. These germs are spread from an infected partner during sex. The infection can spread through contact with the genitals, mouth, or butt. The infection can grow in: ?The urethra. This is the part of the body that drains pee (urine) from the bladder. ?The cervix. This is the lowest part of the womb, or uterus. ?The throat. ?The butt (rectum). ?What increases the risk? ?The following factors may make you more likely to develop this condition: ?Not using a condom the right way. ?Not using a condom each time you have sex. ?Having a new sex partner. ?Having more than one sex partner. ?Being female, age 14-25, and sexually active. ?What are the signs or symptoms? ?In some cases, there are no symptoms (you are asymptomatic), often early in the illness. ?If you get symptoms, they may include: ?Peeing often, or a burning feeling when you pee. ?Fluid (discharge) coming from the vagina. ?Blood or fluid coming from the butt. ?Redness, soreness, or swelling of the butt. ?Belly pain or pain during sex. ?Bleeding between monthly periods or irregular periods. ?Itching, burning, or redness of the eyes. ?Fluid coming from the eyes. ?How is this treated? ?This condition is treated with antibiotic medicines. If you are pregnant, you will not get some types of antibiotics. ?Follow these instructions at home: ?Sexual activity ?Tell your sex partner or partners about  your infection. Sex partners are people you had oral, anal, or vaginal sex with within 60 days of when you started getting sick. They need treatment even if they do not feel or seem sick. ?Do not have sex until: ?You and your sex partners have been treated. ?Your doctor says it is okay. ?If you get just one dose of medicine, wait at least 7 days before having sex. ?General instructions ?Take over-the-counter and prescription medicines as told by your doctor. Finish all antibiotic medicine even when you start to feel better. ?It is up to you to get your test results. Ask your doctor, or the department that is doing the test, when your results will be ready. ?Get a lot of rest. ?Eat healthy foods. ?Drink enough fluid to keep your pee pale yellow. ?Keep all follow-up visits as told by your doctor. This is important. You may need tests after 3 months. ?How is this prevented? ?The only way to keep from getting infected is to not have vaginal, anal, or oral sex. To lower your risk: ?Use latex condoms the right way. Do this every time you have sex. ?Do not have many sex partners. ?Ask if your sex partner got tested for STIs and had negative results. ?Get regular health screenings to check for STIs. ?Contact a doctor if: ?You get new symptoms. ?You do not get better with treatment. ?You have a fever or chills. ?Sex is painful. ?You get new joint pain or swelling near your joints. ?Your periods are irregular. ?You bleed between periods or after sex. ?You get flu-like symptoms. These may be: ?Night   sweats. Sore throat. Muscle aches. You are pregnant and you get symptoms of chlamydia. Get help right away if: Your pain gets worse and does not get better with medicine. You have belly pain that does not get better with medicine. You have lower back pain that does not get better with medicine. You feel weak or dizzy. You faint. Summary Chlamydia is an infection that spreads through sexual contact. This condition is not  hard to treat. But if it is not treated, it can cause worse health problems. Sometimes, there are no symptoms of this condition, often early in the illness. This condition is treated with antibiotic medicines. Use latex condoms the right way. Do this every time you have sex. This information is not intended to replace advice given to you by your health care provider. Make sure you discuss any questions you have with your health care provider. Document Revised: 03/13/2019 Document Reviewed: 03/13/2019 Elsevier Patient Education  2022 [employer].

## 2021-04-14 IMAGING — CT CT CHEST W/O CM
2 of 4 series · 15 of 36 positions shown, 18 images · non-contrast
Comparison: CTA chest 04/16/2019

CLINICAL DATA: Bronchiectasis.

EXAM:
CT CHEST WITHOUT CONTRAST
TECHNIQUE: Multidetector CT imaging of the chest was performed following the
standard protocol without IV contrast.

[Series 2: thorax · axial · 0.67mm/px · z∈[+1464,+1718]mm · 12 of 151 slices shown, 15 images]
[im 12/151  mediastinal]
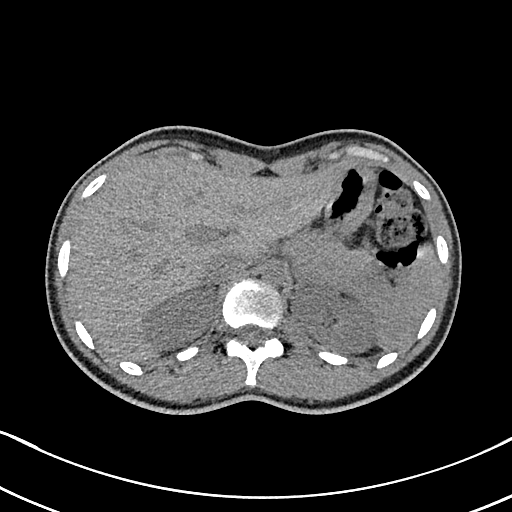
[im 12/151  lung]
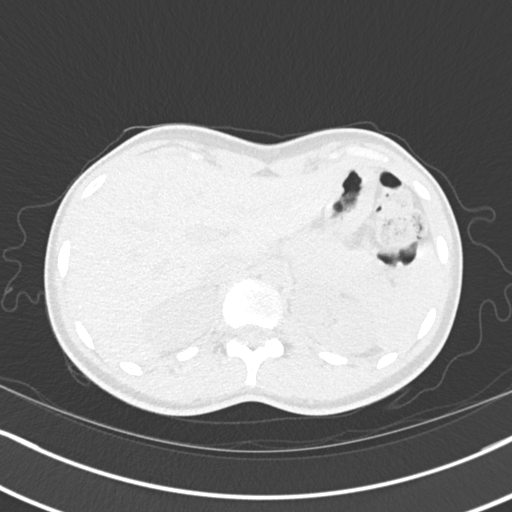
[im 24/151  lung]
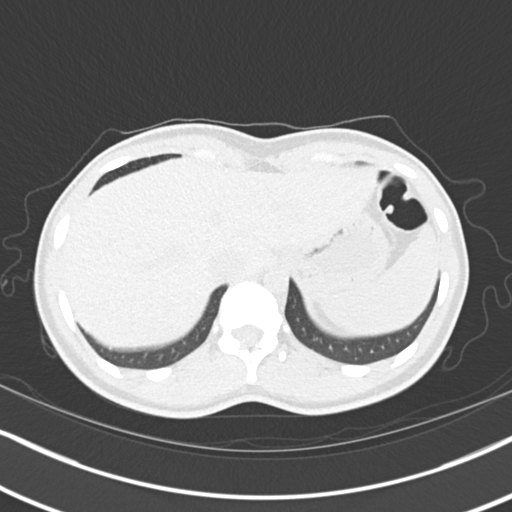
[im 35/151  lung]
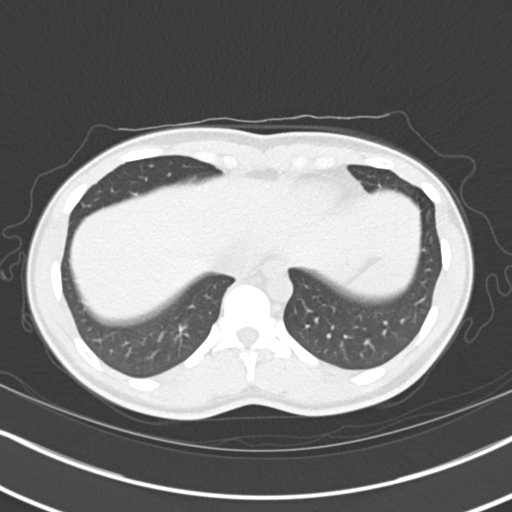
[im 47/151  lung]
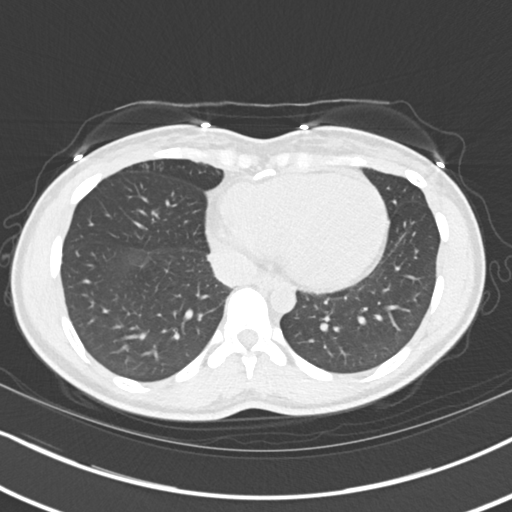
[im 58/151  mediastinal]
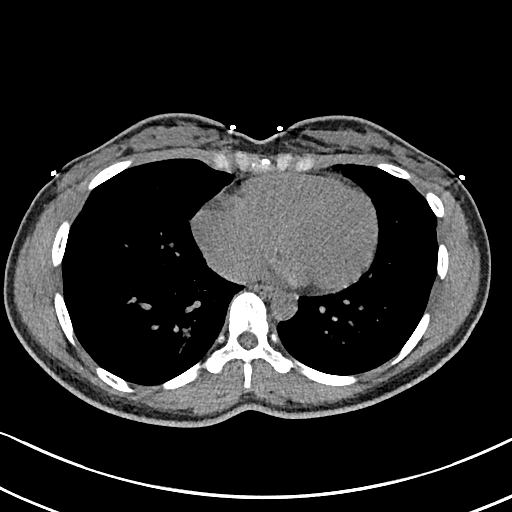
[im 58/151  lung]
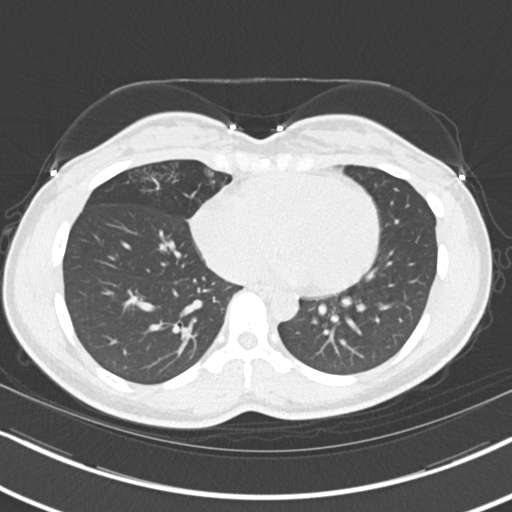
[im 70/151  lung]
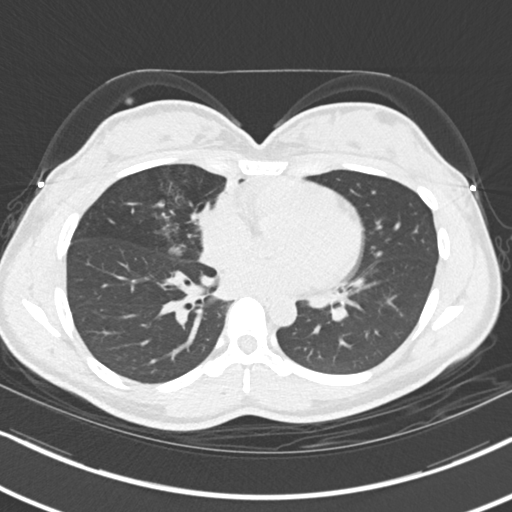
[im 81/151  lung]
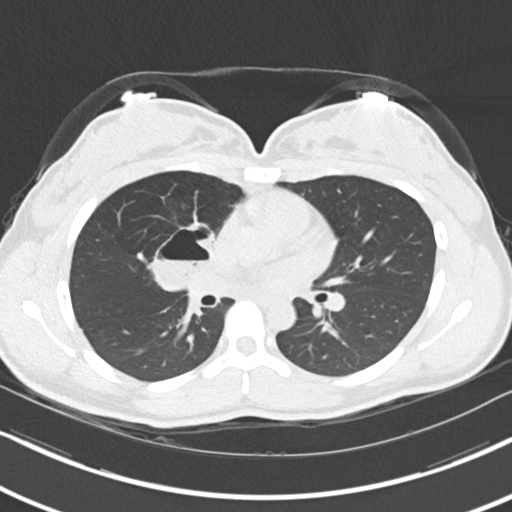
[im 93/151  lung]
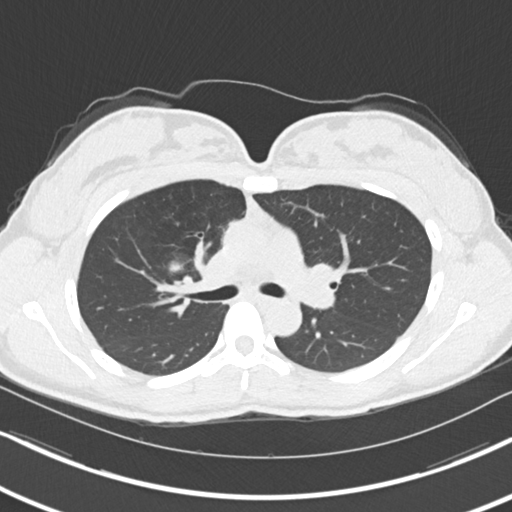
[im 104/151  mediastinal]
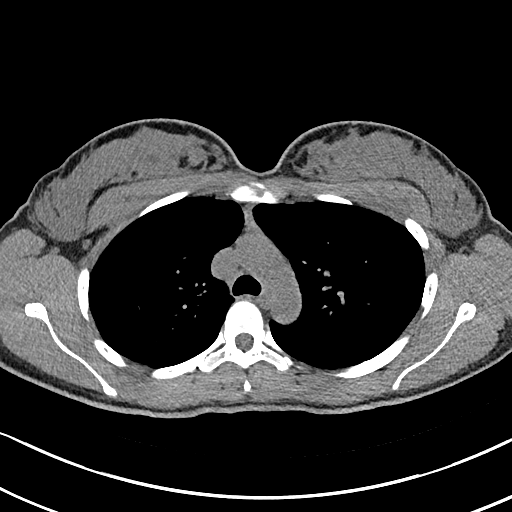
[im 104/151  lung]
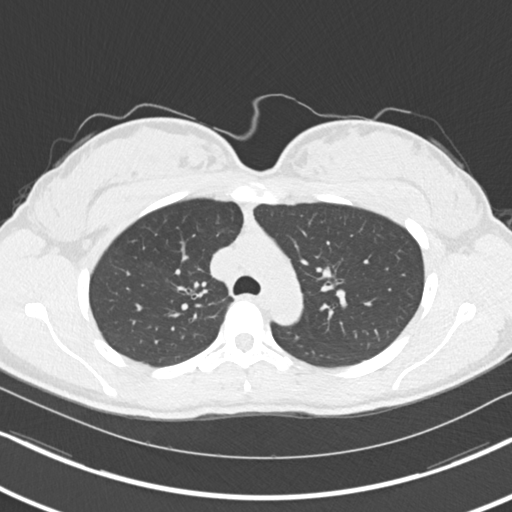
[im 116/151  lung]
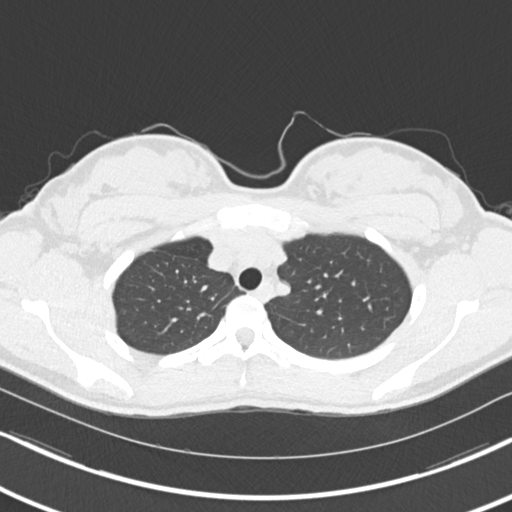
[im 127/151  lung]
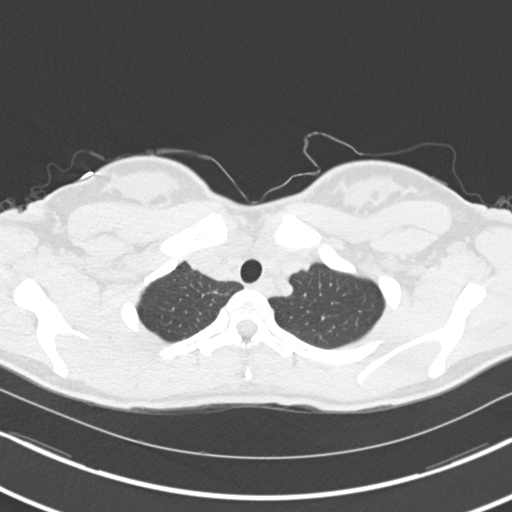
[im 139/151  lung]
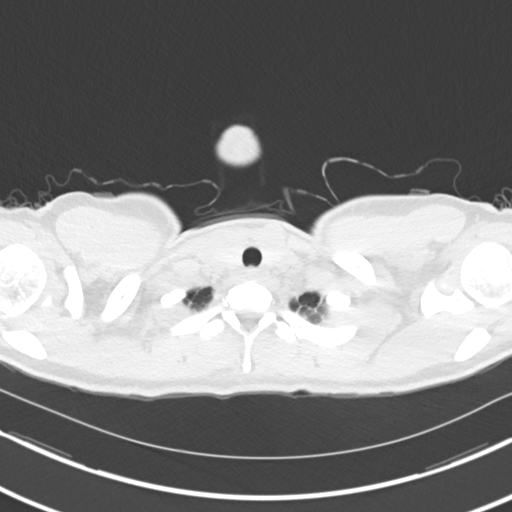

[Series 5: coronal · coronal · 0.59mm/px · 3 of 127 slices shown]
[im 26/127  lung]
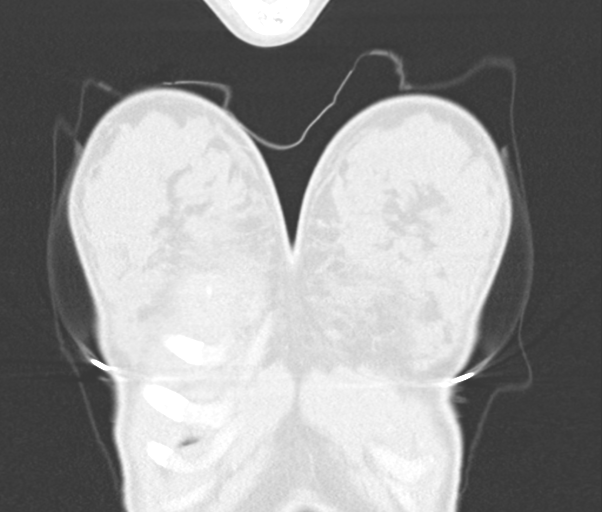
[im 51/127  lung]
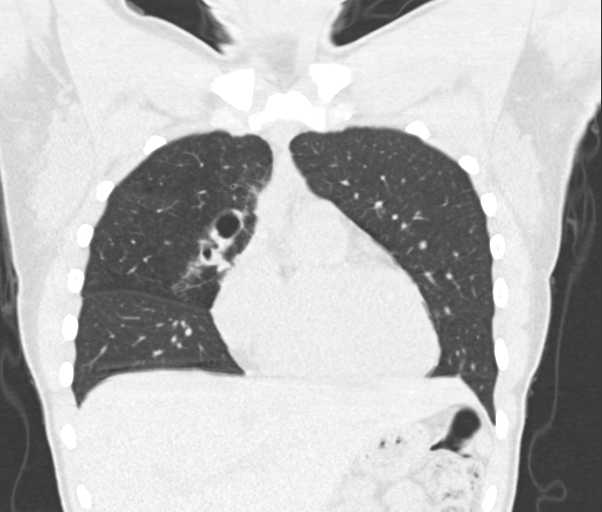
[im 76/127  lung]
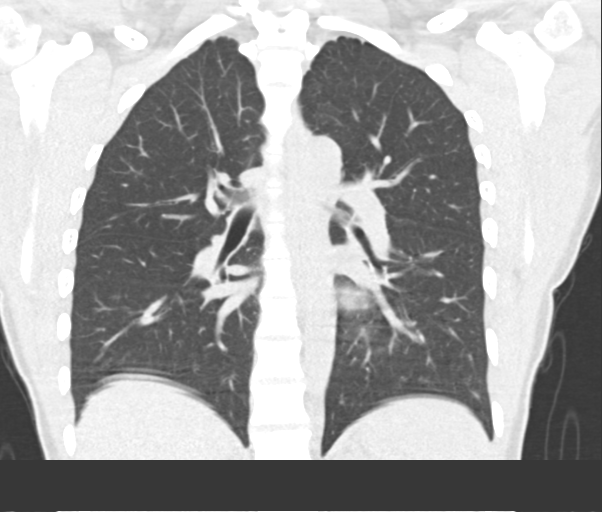

[15 of 36 positions shown; findings below may reference images not displayed]

FINDINGS: Cardiovascular: The heart size is normal. No substantial pericardial
effusion. No thoracic aortic aneurysm.

Mediastinum/Nodes: No mediastinal lymphadenopathy. No evidence for
gross hilar lymphadenopathy although assessment is limited by the
lack of intravenous contrast on today's study. The esophagus has
normal imaging features. There is no axillary lymphadenopathy.

Lungs/Pleura: As on prior study, thin walled cavitary lesion
identified central right upper lobe. This measures 5.0 x 3.3 cm
today compared to 5.6 x 3.4 cm previously. Air-fluid level noted
with debris in the fluid component. This could potentially represent
cystic bronchiectasis. There is a small bronchiectatic component
along the anteromedial margin of the dominant lesion containing a
small ball like focus of soft tissue density (see image 62 of series
3). Bronchiectatic change with peripheral small airway impaction and
tree-in-bud ground-glass nodularity noted right middle lobe,
persistent but decreased in the interval.

No new suspicious nodule or mass. No focal airspace consolidation.
No pleural effusion.

Upper Abdomen: Unremarkable.

Musculoskeletal: No worrisome lytic or sclerotic osseous
abnormality.
IMPRESSION: 1. No substantial interval change in exam. Stable appearance of the
thin walled cystic lesion in the parahilar central right lung
containing fluid level with debris visible in the fluid.
2. Adjacent areas of apparent cylindrical and varicose
bronchiectasis extending into the right middle lobe are also similar
with peripheral small airway impaction and persistent but decreased
peripheral tree-in-bud opacity.
3. Small thin walled cystic focus medial to the cranial aspect of
the dominant thin walled cyst contains a central ball like focus of
soft tissue density. Atypical/fungal infection could have this
appearance.

## 2021-04-16 ENCOUNTER — Encounter: Payer: Self-pay | Admitting: Adult Health

## 2021-04-16 ENCOUNTER — Other Ambulatory Visit: Payer: Self-pay

## 2021-04-16 ENCOUNTER — Ambulatory Visit (INDEPENDENT_AMBULATORY_CARE_PROVIDER_SITE_OTHER): Payer: BC Managed Care – PPO | Admitting: Adult Health

## 2021-04-16 VITALS — BP 105/63 | HR 81 | Ht 66.0 in | Wt 140.0 lb

## 2021-04-16 DIAGNOSIS — Z3046 Encounter for surveillance of implantable subdermal contraceptive: Secondary | ICD-10-CM | POA: Insufficient documentation

## 2021-04-16 DIAGNOSIS — Z113 Encounter for screening for infections with a predominantly sexual mode of transmission: Secondary | ICD-10-CM | POA: Insufficient documentation

## 2021-04-16 NOTE — Progress Notes (Signed)
°  Subjective:     Patient ID: Pam Clark, female   DOB: [birth year], 26 y.o.   MRN: [id number]  HPI Pam Clark is a 26 year old black female,single, G0P0, in for nexplanon removal. PCP is [facility]  Lab Results  Component Value Date   DIAGPAP  05/31/2020    - Negative for intraepithelial lesion or malignancy (NILM)    Review of Systems For nexplanon removal Reviewed past medical,surgical, social and family history. Reviewed medications and allergies.     Objective:   Physical Exam BP 105/63 (BP Location: Right Arm, Patient Position: Sitting, Cuff Size: Normal)    Pulse 81    Ht 5\' 6"  (1.676 m)    Wt 140 lb (63.5 kg)    BMI 22.60 kg/m   Consent signed and time out called.   Left arm cleansed with betadine, and injected with 1.5 cc 2% lidocaine and waited til numb.Under sterile technique a #11 blade was used to make small vertical incision, and a curved forceps was used to easily remove rod. Steri strips applied. Pressure dressing applied.   [facility] - 04/16/21 1020       Pregnancy Intention Screening   Does the patient want to become pregnant in the next year? No    Does the patient's partner want to become pregnant in the next year? No    Would the patient like to discuss contraceptive options today? Yes      Contraception Wrap Up   Current Method Hormonal Implant    End Method Female Condom    Contraception Counseling Provided Yes            And she is aware Plan B.  Assessment:     1. Screen for STD (sexually transmitted disease) Urine sent for GC/CHL and trich   2. Nexplanon removal Use condoms, keep clean and dry x 24 hours, no heavy lifting, keep steri strips on x 72 hours, Keep pressure dressing on x 24 hours. Follow up prn problems.     Plan:     Follow up prn

## 2021-04-16 NOTE — Patient Instructions (Signed)
Use condoms, keep clean and dry x 24 hours, no heavy lifting, keep steri strips on x 72 hours, Keep pressure dressing on x 24 hours. Follow up prn problems.  

## 2021-04-21 ENCOUNTER — Telehealth: Payer: Self-pay

## 2021-04-21 ENCOUNTER — Encounter: Payer: Self-pay | Admitting: Physician Assistant

## 2021-04-21 DIAGNOSIS — A749 Chlamydial infection, unspecified: Secondary | ICD-10-CM

## 2021-04-21 HISTORY — DX: Chlamydial infection, unspecified: A74.9

## 2021-04-21 LAB — GC/CHLAMYDIA PROBE AMP
Chlamydia trachomatis, NAA: POSITIVE — AB
Neisseria Gonorrhoeae by PCR: NEGATIVE

## 2021-04-21 LAB — TRICHOMONAS VAGINALIS, PROBE AMP: Trich vag by NAA: NEGATIVE

## 2021-04-21 MED ORDER — AZITHROMYCIN 500 MG PO TABS: ORAL_TABLET | ORAL | 0 refills | Status: DC

## 2021-04-21 NOTE — Telephone Encounter (Signed)
Pt aware of +chlamydia, rx sent in for azithromycin 500 mg # 2 2 po now, no sex POC in 4 weeks ,[facility] sent. She told partner to go to clinic. She is allergic to doxycycline

## 2021-04-21 NOTE — Telephone Encounter (Signed)
Pt stated that she saw her results in Mychart and wants a nurse to call her about where to send prescription.

## 2021-04-21 NOTE — Addendum Note (Signed)
Addended by: Cyril Mourning A on: 04/21/2021 04:34 PM   Modules accepted: Orders

## 2021-04-21 NOTE — Telephone Encounter (Signed)
I think this was accidentally sent to me.   Forwarding message to Cyril Mourning, NP that saw patient and performed testing on 04/16/21.

## 2021-04-25 ENCOUNTER — Telehealth: Payer: BC Managed Care – PPO | Admitting: Physician Assistant

## 2021-04-25 DIAGNOSIS — B370 Candidal stomatitis: Secondary | ICD-10-CM | POA: Diagnosis not present

## 2021-04-25 MED ORDER — NYSTATIN 100000 UNIT/ML MT SUSP: 5.0000 mL | Freq: Four times a day (QID) | OROMUCOSAL | 0 refills | Status: DC

## 2021-04-25 NOTE — Progress Notes (Signed)
Virtual Visit Consent   Pam Clark, you are scheduled for a virtual visit with a [facility] provider today.     Just as with appointments in the office, your consent must be obtained to participate.  Your consent will be active for this visit and any virtual visit you may have with one of our providers in the next 365 days.     If you have a MyChart account, a copy of this consent can be sent to you electronically.  All virtual visits are billed to your insurance company just like a traditional visit in the office.    As this is a virtual visit, video technology does not allow for your provider to perform a traditional examination.  This may limit your provider's ability to fully assess your condition.  If your provider identifies any concerns that need to be evaluated in person or the need to arrange testing (such as labs, EKG, etc.), we will make arrangements to do so.     Although advances in technology are sophisticated, we cannot ensure that it will always work on either your end or our end.  If the connection with a video visit is poor, the visit may have to be switched to a telephone visit.  With either a video or telephone visit, we are not always able to ensure that we have a secure connection.     I need to obtain your verbal consent now.   Are you willing to proceed with your visit today?    Pam Clark has provided verbal consent on 04/25/2021 for a virtual visit (video or telephone).   Pam Loveless, PA-C   Date: 04/25/2021 1:47 PM   Virtual Visit via Video Note   I, Pam Clark, connected with  Pam Clark  ([id number], 01/05/1996) on 04/25/21 at  1:30 PM EST by a video-enabled telemedicine application and verified that I am speaking with the correct person using two identifiers.  Location: Patient: Mobile Provider: Virtual Visit Location Provider: Home Office   I discussed the limitations of evaluation and management by  telemedicine and the availability of in person appointments. The patient expressed understanding and agreed to proceed.    History of Present Illness: Pam Clark is a 27 y.o. who identifies as a female who was assigned female at birth, and is being seen today for sore throat.  HPI: Sore Throat  This is a new problem. The current episode started in the past 7 days (wednesday; was recently treated for Chlamyida with Azithromycin 1g, symptoms started very next day). The problem has been gradually worsening. There has been no fever. Associated symptoms include a hoarse voice (when talking loud, has throat pain and strain of voice), neck pain and trouble swallowing (sore to swallow). Pertinent negatives include no congestion, coughing, drooling, ear pain, headaches, shortness of breath, stridor, swollen glands or vomiting. She has had no exposure to strep or mono.     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   Bronchiectasis without complication (HCC) 10/09/2019    Allergies:  Allergies  Allergen Reactions   Doxycycline Nausea And Vomiting   Medications:  Current Outpatient Medications:    nystatin (MYCOSTATIN) 100000 UNIT/ML suspension, Take 5 mLs (500,000 Units total) by mouth 4 (four) times daily., Disp: 60 mL, Rfl: 0  albuterol (VENTOLIN HFA) 108 (90 Base) MCG/ACT inhaler, Inhale 2 puffs into the lungs every 6 (six) hours as needed for wheezing or shortness of breath., Disp: 8 g, Rfl: 0   azithromycin (ZITHROMAX) 500 MG tablet, Take 2 po now, Disp: 2 tablet, Rfl: 0   benzonatate (TESSALON) 100 MG capsule, Take 1 capsule (100 mg total) by mouth 3 (three) times daily as needed for cough. (Patient not taking: Reported on 04/16/2021), Disp: 30 capsule, Rfl: 0   CRANBERRY EXTRACT PO, Take by mouth., Disp: , Rfl:     FENUGREEK PO, Take by mouth., Disp: , Rfl:    Ferrous Sulfate (IRON PO), Take 1 tablet by mouth daily., Disp: , Rfl:   Observations/Objective: Patient is well-developed, well-nourished in no acute distress.  Resting comfortably Head is normocephalic, atraumatic.  No labored breathing.  Speech is clear and coherent with logical content.  Patient is alert and oriented at baseline.    Assessment and Plan: 1. Thrush - nystatin (MYCOSTATIN) 100000 UNIT/ML suspension; Take 5 mLs (500,000 Units total) by mouth 4 (four) times daily.  Dispense: 60 mL; Refill: 0  - Suspect Thrush from recent antibiotic use, especially since no other symptoms - Swish and swallow medication until completed - Seek in person evaluation for further testing if not improving  Follow Up Instructions: I discussed the assessment and treatment plan with the patient. The patient was provided an opportunity to ask questions and all were answered. The patient agreed with the plan and demonstrated an understanding of the instructions.  A copy of instructions were sent to the patient via MyChart unless otherwise noted below.    The patient was advised to call back or seek an in-person evaluation if the symptoms worsen or if the condition fails to improve as anticipated.  Time:  I spent 17 minutes with the patient via telehealth technology discussing the above problems/concerns.    Mar Daring, PA-C

## 2021-04-25 NOTE — Patient Instructions (Signed)
Pam Clark, thank you for joining [name], PA-C for today's virtual visit.  While this provider is not your primary care provider (PCP), if your PCP is located in our provider database this encounter information will be shared with them immediately following your visit.  Consent: (Patient) Pam Clark provided verbal consent for this virtual visit at the beginning of the encounter.  Current Medications:  Current Outpatient Medications:    nystatin (MYCOSTATIN) 100000 UNIT/ML suspension, Take 5 mLs (500,000 Units total) by mouth 4 (four) times daily., Disp: 60 mL, Rfl: 0   albuterol (VENTOLIN HFA) 108 (90 Base) MCG/ACT inhaler, Inhale 2 puffs into the lungs every 6 (six) hours as needed for wheezing or shortness of breath., Disp: 8 g, Rfl: 0   azithromycin (ZITHROMAX) 500 MG tablet, Take 2 po now, Disp: 2 tablet, Rfl: 0   benzonatate (TESSALON) 100 MG capsule, Take 1 capsule (100 mg total) by mouth 3 (three) times daily as needed for cough. (Patient not taking: Reported on 04/16/2021), Disp: 30 capsule, Rfl: 0   CRANBERRY EXTRACT PO, Take by mouth., Disp: , Rfl:    FENUGREEK PO, Take by mouth., Disp: , Rfl:    Ferrous Sulfate (IRON PO), Take 1 tablet by mouth daily., Disp: , Rfl:    Medications ordered in this encounter:  Meds ordered this encounter  Medications   nystatin (MYCOSTATIN) 100000 UNIT/ML suspension    Sig: Take 5 mLs (500,000 Units total) by mouth 4 (four) times daily.    Dispense:  60 mL    Refill:  0    Order Specific Question:   Supervising Provider    Answer:   Hyacinth Meeker, BRIAN [[id number]]     *If you need refills on other medications prior to your next appointment, please contact your pharmacy*  Follow-Up: Call back or seek an in-person evaluation if the symptoms worsen or if the condition fails to improve as anticipated.  Other Instructions Oral Thrush, Adult Oral thrush, also called oral candidiasis, is a fungal infection that develops in  the mouth and throat and on the tongue. It causes white patches to form in the mouth and on the tongue. Many cases of thrush are mild, but this infection can also be serious. Ginette Pitman can be a repeated (recurrent) problem for certain people who have a weak body defense system (immune system). The weakness can be caused by chronic illnesses, or by taking medicines that limit the body's ability to fight infection. If a person has difficulty fighting infection, the fungus that causes thrush can spread through the body. This can cause life-threatening blood or organ infections. What are the causes? This condition is caused by a fungus (yeast) called Candida albicans. This fungus is normally present in small amounts in the mouth and on other mucous membranes. It usually causes no harm. If conditions are present that allow the fungus to grow without control, it invades surrounding tissues and becomes an infection. Other Candida species can also lead to thrush, though this is rare. What increases the risk? The following factors may make you more likely to develop this condition: Having a weakened immune system. Being an older adult. Having diabetes, cancer, or HIV (human immunodeficiency virus). Having dry mouth (xerostomia). Being pregnant or breastfeeding. Having poor dental care, especially in those who have dentures. Using antibiotic or steroid medicines. What are the signs or symptoms? Symptoms of this condition can vary from mild and moderate to severe and persistent. Symptoms may include: A burning feeling in  the mouth and throat. This can occur at the start of a thrush infection. White patches that stick to the mouth and tongue. The tissue around the patches may be red, raw, and painful. If rubbed (during tooth brushing, for example), the patches and the tissue of the mouth may bleed easily. A bad taste in the mouth or difficulty tasting foods. A cottony feeling in the mouth. Pain during eating  and swallowing. Poor appetite. Cracking at the corners of the mouth. How is this diagnosed? This condition is diagnosed based on: A physical exam. Your medical history. How is this treated? This condition is treated with medicines called antifungals, which prevent the growth of fungi. These medicines are either applied directly to the affected area (topical) or swallowed (oral). The treatment will depend on the severity of the condition. Mild cases of thrush may be treated with an antifungal mouth rinse or lozenges. Treatment usually lasts about 14 days. Moderate to severe cases of thrush can be treated with oral antifungal medicine, if they have spread to the esophagus. A topical antifungal medicine may also be used. For some severe infections, treatment may need to continue for more than 14 days. Oral antifungal medicines are rarely used during pregnancy because they may be harmful to the unborn child. If you are pregnant, talk with your health care provider about options for treatment. Persistent or recurrent thrush. For cases of thrush that do not go away or keep coming back: Treatment may be needed twice as long as the symptoms last. Treatment will include both oral and topical antifungal medicines. People with a weakened immune system can take an antifungal medicine on a continuous basis to prevent thrush infections. It is important to treat conditions that make a person more likely to get thrush, such as diabetes or HIV. Follow these instructions at home: Medicines Take or use over-the-counter and prescription medicines only as told by your health care provider. Talk with your health care provider about an over-the-counter medicine called gentian violet, which kills bacteria and fungi. Relieving soreness and discomfort To help reduce the discomfort of thrush: Drink cold liquids such as water or iced tea. Try flavored ice treats or frozen juices. Eat foods that are easy to swallow,  such as gelatin, ice cream, or custard. Try drinking from a straw if the patches in your mouth are painful.  General instructions Eat plain, unflavored yogurt as directed by your health care provider. Check the label to make sure the yogurt contains live cultures. This yogurt can help healthy bacteria grow in the mouth and can stop the growth of the fungus that causes thrush. If you wear dentures, remove the dentures before going to bed, brush them vigorously, and soak them in a cleaning solution as directed by your health care provider. Rinse your mouth with a warm salt-water mixture several times a day. To make a salt-water mixture, dissolve -1 tsp (3-6 g) of salt in 1 cup (237 mL) of warm water. Contact a health care provider if: Your symptoms are getting worse or are not improving within 7 days of starting treatment. You have symptoms of a spreading infection, such as white patches on the skin outside of the mouth. You are breastfeeding your baby and you have redness and pain in the nipples. Summary Oral thrush, also called oral candidiasis, is a fungal infection that develops in the mouth and throat and on the tongue. It causes white patches to form in the mouth and on the tongue. You are  more likely to get this condition if you have a weakened immune system or an underlying condition, such as HIV, cancer, or diabetes. This condition is treated with medicines called antifungals, which prevent the growth of fungi. Contact a health care provider if your symptoms do not improve, or get worse, within 7 days of starting treatment. This information is not intended to replace advice given to you by your health care provider. Make sure you discuss any questions you have with your health care provider. Document Revised: 01/20/2019 Document Reviewed: 01/20/2019 Elsevier Patient Education  2022 [employer].    If you have been instructed to have an in-person evaluation today at a local Urgent Care  facility, please use the link below. It will take you to a list of all of our available [facility], including address, phone number and hours of operation. Please do not delay care.  [facility]  If you or a family member do not have a primary care provider, use the link below to schedule a visit and establish care. When you choose a [facility] primary care physician or advanced practice provider, you gain a long-term partner in health. Find a Primary Care Provider  Learn more about [facility] in-office and virtual care options: [facility] Get Care Now

## 2021-06-13 ENCOUNTER — Telehealth: Payer: Self-pay | Admitting: *Deleted

## 2021-06-13 MED ORDER — AZITHROMYCIN 500 MG PO TABS: ORAL_TABLET | ORAL | 0 refills | Status: DC

## 2021-06-13 NOTE — Telephone Encounter (Signed)
Rx azithromycin 1 gm,positive chlamydia contact ?

## 2021-06-13 NOTE — Telephone Encounter (Signed)
Patients partner tested positive for chlamydia. She is requesting treatment.  ?

## 2021-06-16 ENCOUNTER — Telehealth: Payer: BC Managed Care – PPO | Admitting: Emergency Medicine

## 2021-06-16 ENCOUNTER — Telehealth: Payer: BC Managed Care – PPO

## 2021-06-16 DIAGNOSIS — J189 Pneumonia, unspecified organism: Secondary | ICD-10-CM

## 2021-06-16 MED ORDER — ALBUTEROL SULFATE HFA 108 (90 BASE) MCG/ACT IN AERS: 2.0000 | INHALATION_SPRAY | Freq: Four times a day (QID) | RESPIRATORY_TRACT | 2 refills | Status: DC | PRN

## 2021-06-16 MED ORDER — AMOXICILLIN-POT CLAVULANATE 875-125 MG PO TABS: 1.0000 | ORAL_TABLET | Freq: Two times a day (BID) | ORAL | 0 refills | Status: DC

## 2021-06-16 NOTE — Progress Notes (Signed)
?Virtual Visit Consent  ? ?[name], you are scheduled for a virtual visit with a [facility] provider today.   ?  ?Just as with appointments in the office, your consent must be obtained to participate.  Your consent will be active for this visit and any virtual visit you may have with one of our providers in the next 365 days.   ?  ?If you have a MyChart account, a copy of this consent can be sent to you electronically.  All virtual visits are billed to your insurance company just like a traditional visit in the office.   ? ?As this is a virtual visit, video technology does not allow for your provider to perform a traditional examination.  This may limit your provider's ability to fully assess your condition.  If your provider identifies any concerns that need to be evaluated in person or the need to arrange testing (such as labs, EKG, etc.), we will make arrangements to do so.   ?  ?Although advances in technology are sophisticated, we cannot ensure that it will always work on either your end or our end.  If the connection with a video visit is poor, the visit may have to be switched to a telephone visit.  With either a video or telephone visit, we are not always able to ensure that we have a secure connection.    ? ?I need to obtain your verbal consent now.   Are you willing to proceed with your visit today?  ?  ?Pam Clark has provided verbal consent on 06/16/2021 for a virtual visit (video or telephone). ?  ?Cathlyn Parsons, NP  ? ?Date: 06/16/2021 12:55 PM ? ? ?Virtual Visit via Video Note  ? ?ICathlyn Parsons, connected with  Pam Clark  ([id number], April 15, 1995) on 06/16/21 at 12:45 PM EDT by a video-enabled telemedicine application and verified that I am speaking with the correct person using two identifiers. ? ?Location: ?Patient: Virtual Visit Location Patient: Home ?Provider: Virtual Visit Location Provider: Home Office ?  ?I discussed the limitations of evaluation and  management by telemedicine and the availability of in person appointments. The patient expressed understanding and agreed to proceed.   ? ?History of Present Illness: ?Pam Clark is a 26 y.o. who identifies as a female who was assigned female at birth, and is being seen today for green sputum. Pt has been sick for a week with cough and nasal congestion. Had a mild sore throat initially but that resolved. In last 2 days has felt worse with chills, productive cough with green sputum, nasal congestion, body aches, fatigue. Thinks she might have a fever but hasn't taken her temperature. Nasal drainage is clear and she denies sinus pressure/pain. Has been using nasocort, vicks vapor rub, and saline spray to manage nasal congestion. Is out of albuterol. Does report wheezing, no SOB.  ? ?HPI: HPI  ?Problems:  ?Patient Active Problem List  ? Diagnosis Date Noted  ? Chlamydia 04/21/2021  ? Nexplanon removal 04/16/2021  ? Screen for STD (sexually transmitted disease) 04/16/2021  ? S/P lobectomy of lung 01/29/2020  ? Bronchiectasis without complication (HCC) 10/09/2019  ?  ?Allergies:  ?Allergies  ?Allergen Reactions  ? Doxycycline Nausea And Vomiting  ? ?Medications:  ?Current Outpatient Medications:  ?  amoxicillin-clavulanate (AUGMENTIN) 875-125 MG tablet, Take 1 tablet by mouth 2 (two) times daily., Disp: 14 tablet, Rfl: 0 ?  albuterol (VENTOLIN HFA) 108 (90 Base) MCG/ACT inhaler, Inhale 2  puffs into the lungs every 6 (six) hours as needed for wheezing or shortness of breath., Disp: 8 g, Rfl: 2 ?  azithromycin (ZITHROMAX) 500 MG tablet, Take 2 po now, Disp: 2 tablet, Rfl: 0 ?  benzonatate (TESSALON) 100 MG capsule, Take 1 capsule (100 mg total) by mouth 3 (three) times daily as needed for cough. (Patient not taking: Reported on 04/16/2021), Disp: 30 capsule, Rfl: 0 ?  CRANBERRY EXTRACT PO, Take by mouth., Disp: , Rfl:  ?  FENUGREEK PO, Take by mouth., Disp: , Rfl:  ?  Ferrous Sulfate (IRON PO), Take 1 tablet by  mouth daily., Disp: , Rfl:  ?  nystatin (MYCOSTATIN) 100000 UNIT/ML suspension, Take 5 mLs (500,000 Units total) by mouth 4 (four) times daily., Disp: 60 mL, Rfl: 0 ? ?Observations/Objective: ?Patient is well-developed, well-nourished in no acute distress.  ?Resting comfortably  at home.  ?Head is normocephalic, atraumatic.  ?No labored breathing.  ?Speech is clear and coherent with logical content.  ?Patient is alert and oriented at baseline.  ?Pt sounds congested. Wet sounding cough heard.  ? ?Assessment and Plan: ?1. Community acquired pneumonia, unspecified laterality ?- albuterol (VENTOLIN HFA) 108 (90 Base) MCG/ACT inhaler; Inhale 2 puffs into the lungs every 6 (six) hours as needed for wheezing or shortness of breath.  Dispense: 8 g; Refill: 2 ? ?I suspect based on symptoms she may have pneumonia. Will treat with augmentin. Rx albuterol. Reviewed supportive care measures. Given note for work.  ? ?Follow Up Instructions: ?I discussed the assessment and treatment plan with the patient. The patient was provided an opportunity to ask questions and all were answered. The patient agreed with the plan and demonstrated an understanding of the instructions.  A copy of instructions were sent to the patient via MyChart unless otherwise noted below.  ? ?The patient was advised to call back or seek an in-person evaluation if the symptoms worsen or if the condition fails to improve as anticipated. ? ?Time:  ?I spent 10 minutes with the patient via telehealth technology discussing the above problems/concerns.   ? ?Cathlyn Parsons, NP ?

## 2021-06-16 NOTE — Patient Instructions (Addendum)
?  Pam Clark, thank you for joining Pam Parsons, NP for today's virtual visit.  While this provider is not your primary care provider (PCP), if your PCP is located in our provider database this encounter information will be shared with them immediately following your visit. ? ?Consent: ?(Patient) Pam Clark provided verbal consent for this virtual visit at the beginning of the encounter. ? ?Current Medications: ? ?Current Outpatient Medications:  ?  amoxicillin-clavulanate (AUGMENTIN) 875-125 MG tablet, Take 1 tablet by mouth 2 (two) times daily., Disp: 14 tablet, Rfl: 0 ?  albuterol (VENTOLIN HFA) 108 (90 Base) MCG/ACT inhaler, Inhale 2 puffs into the lungs every 6 (six) hours as needed for wheezing or shortness of breath., Disp: 8 g, Rfl: 2 ?  azithromycin (ZITHROMAX) 500 MG tablet, Take 2 po now, Disp: 2 tablet, Rfl: 0 ?  benzonatate (TESSALON) 100 MG capsule, Take 1 capsule (100 mg total) by mouth 3 (three) times daily as needed for cough. (Patient not taking: Reported on 04/16/2021), Disp: 30 capsule, Rfl: 0 ?  CRANBERRY EXTRACT PO, Take by mouth., Disp: , Rfl:  ?  FENUGREEK PO, Take by mouth., Disp: , Rfl:  ?  Ferrous Sulfate (IRON PO), Take 1 tablet by mouth daily., Disp: , Rfl:  ?  nystatin (MYCOSTATIN) 100000 UNIT/ML suspension, Take 5 mLs (500,000 Units total) by mouth 4 (four) times daily., Disp: 60 mL, Rfl: 0  ? ?Medications ordered in this encounter:  ?Meds ordered this encounter  ?Medications  ? albuterol (VENTOLIN HFA) 108 (90 Base) MCG/ACT inhaler  ?  Sig: Inhale 2 puffs into the lungs every 6 (six) hours as needed for wheezing or shortness of breath.  ?  Dispense:  8 g  ?  Refill:  2  ? amoxicillin-clavulanate (AUGMENTIN) 875-125 MG tablet  ?  Sig: Take 1 tablet by mouth 2 (two) times daily.  ?  Dispense:  14 tablet  ?  Refill:  0  ?  ? ?*If you need refills on other medications prior to your next appointment, please contact your pharmacy* ? ?Follow-Up: ?Call back or seek an  in-person evaluation if the symptoms worsen or if the condition fails to improve as anticipated. ? ?Other Instructions ?Keep using Nsocort, saline nasal spray, and Vicks Vapor Rub. Drink lots of liquids to stay hydrated. Try taking Mucinex (or generic guaifenesin) to help think out your congestion to make it easier to cough up/drain from your nose.  ? ?Use your inhaler if needed for wheezing or shortness of breath ? ?If you have been instructed to have an in-person evaluation today at a local Urgent Care facility, please use the link below. It will take you to a list of all of our available [facility], including address, phone number and hours of operation. Please do not delay care.  ?[facility] ? ?If you or a family member do not have a primary care provider, use the link below to schedule a visit and establish care. When you choose a [facility] primary care physician or advanced practice provider, you gain a long-term partner in health. ?Find a Primary Care Provider ? ?Learn more about [facility]'s in-office and virtual care options: ?[facility] - Get Care Now  ?

## 2021-06-17 ENCOUNTER — Other Ambulatory Visit: Payer: Self-pay

## 2021-06-17 ENCOUNTER — Ambulatory Visit: Payer: BC Managed Care – PPO

## 2021-06-17 ENCOUNTER — Encounter: Payer: Self-pay | Admitting: Obstetrics & Gynecology

## 2021-06-17 ENCOUNTER — Ambulatory Visit (INDEPENDENT_AMBULATORY_CARE_PROVIDER_SITE_OTHER): Payer: BC Managed Care – PPO | Admitting: Obstetrics & Gynecology

## 2021-06-17 VITALS — BP 103/70 | HR 99 | Wt 141.2 lb

## 2021-06-17 DIAGNOSIS — Z3202 Encounter for pregnancy test, result negative: Secondary | ICD-10-CM | POA: Diagnosis not present

## 2021-06-17 DIAGNOSIS — Z30013 Encounter for initial prescription of injectable contraceptive: Secondary | ICD-10-CM

## 2021-06-17 LAB — POCT URINE PREGNANCY: Preg Test, Ur: NEGATIVE

## 2021-06-17 MED ORDER — MEDROXYPROGESTERONE ACETATE 150 MG/ML IM SUSP: 150.0000 mg | INTRAMUSCULAR | 4 refills | Status: DC

## 2021-06-17 MED ORDER — MEDROXYPROGESTERONE ACETATE 150 MG/ML IM SUSP
150.0000 mg | Freq: Once | INTRAMUSCULAR | Status: AC
  Administered 2021-06-17: 150 mg via INTRAMUSCULAR

## 2021-06-17 NOTE — Progress Notes (Signed)
? ?  GYN VISIT ?Patient name: Pam Clark MRN [id number]  Date of birth: [birth year] ?Chief Complaint:   ?Contraception (Wants to start depo) ? ?History of Present Illness:   ?Pam Clark is a 26 y.o. G0P0000  female being seen today for the following: ? ?-Contraception: Currently on Depot- menses will last for about a 1-2weeks.  Denies intermentsrual bleeding.  Prefers Depot for the weight gain. In the past has had Nexplanon.   Options reviewed and for now wishes to continue with Depot ? ?Denies vaginal discharge, itching or irritation.  Denies pelvic or abdominal pain.  No other acute complaints or concerns ? ?Patient's last menstrual period was 05/25/2021. ? ? ?Review of Systems:   ?Pertinent items are noted in HPI ?Denies fever/chills, dizziness, headaches, visual disturbances, fatigue, shortness of breath, chest pain, abdominal pain, vomiting, no problems with periods, bowel movements, urination, or intercourse unless otherwise stated above.  ?Pertinent History Reviewed:  ?Reviewed past medical,surgical, social, obstetrical and family history.  ?Reviewed problem list, medications and allergies. ?Physical Assessment:  ? ?Vitals:  ? 06/17/21 1613  ?BP: 103/70  ?Pulse: 99  ?Weight: 141 lb 3.2 oz (64 kg)  ?Body mass index is 22.79 kg/m?. ? ?     Physical Examination:  ? General appearance: alert, well appearing, and in no distress ? Psych: mood appropriate, normal affect ? Skin: warm & dry  ? Cardiovascular: normal heart rate noted ? Respiratory: normal respiratory effort, no distress ? Abdomen: soft, non-tender  ? Pelvic: examination not indicated ? Extremities: no edema  ? ?Chaperone: N/A   ? ?Assessment & Plan:  ?1) Contraceptive management ?Desires Depot, first shot today ?F/U every 3 mos ? ?2) Recent Chlamydia exposure ?-treatment completed ?-plan to f/u in 4 wks for TOC and annual ? ? ?Orders Placed This Encounter  ?Procedures  ? POCT urine pregnancy  ? ? ?Return in about 4 weeks (around  07/15/2021) for Annual/TOC. ? ? ?Myna Hidalgo, DO ?Attending Obstetrician & Gynecologist, Faculty Practice ?Center for [employer], [facility] ? ? ? ?

## 2021-06-17 NOTE — Progress Notes (Signed)
Depo Provera 150mg  given in Left deltoid with no complications.  ?

## 2021-06-24 IMAGING — CR DG CHEST 2V
2 series · 2 of 2 positions shown · non-contrast
Comparison: February 02, 2020

CLINICAL DATA: Status post right upper lobectomy

EXAM:
CHEST - 2 VIEW

[w chest pa]
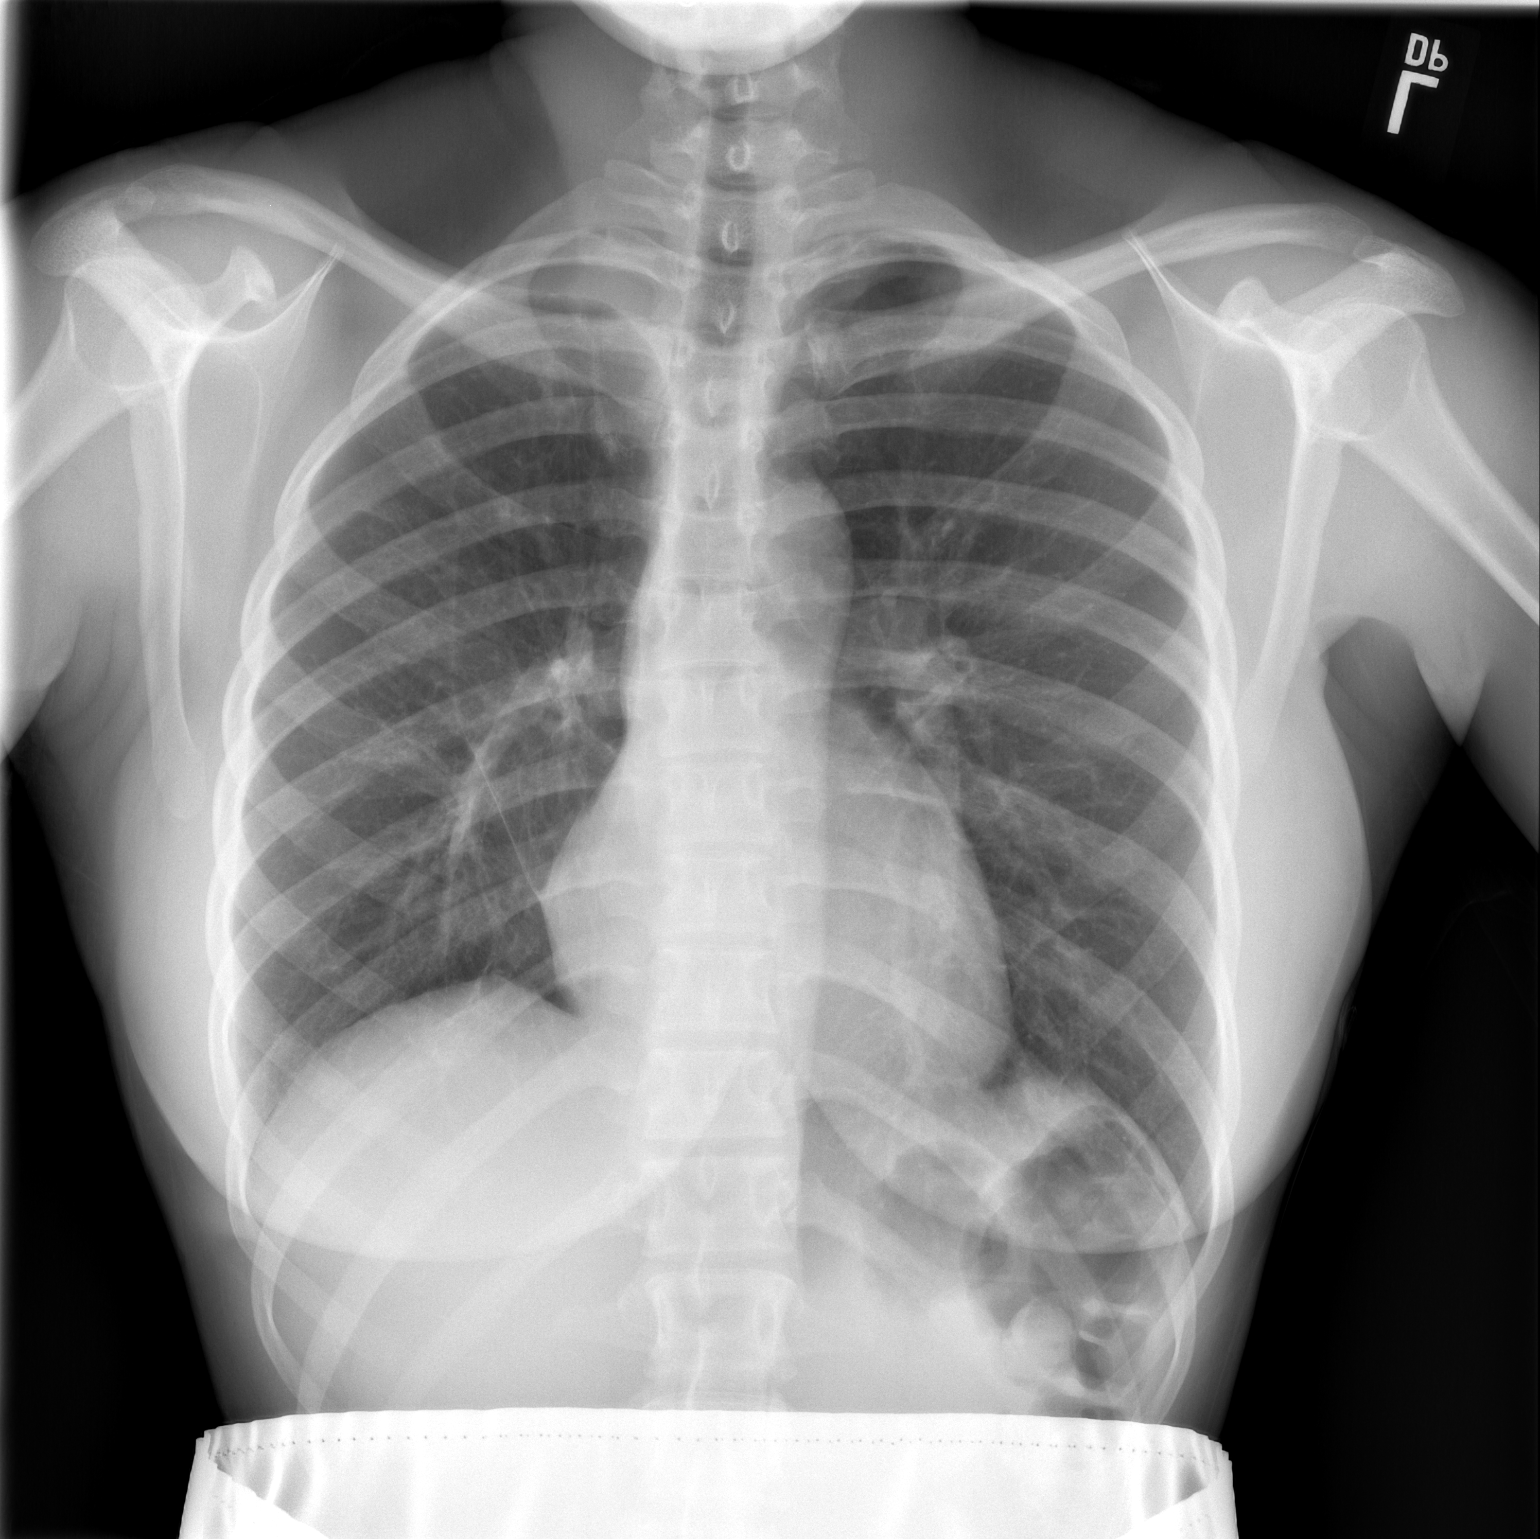

[w chest lat]
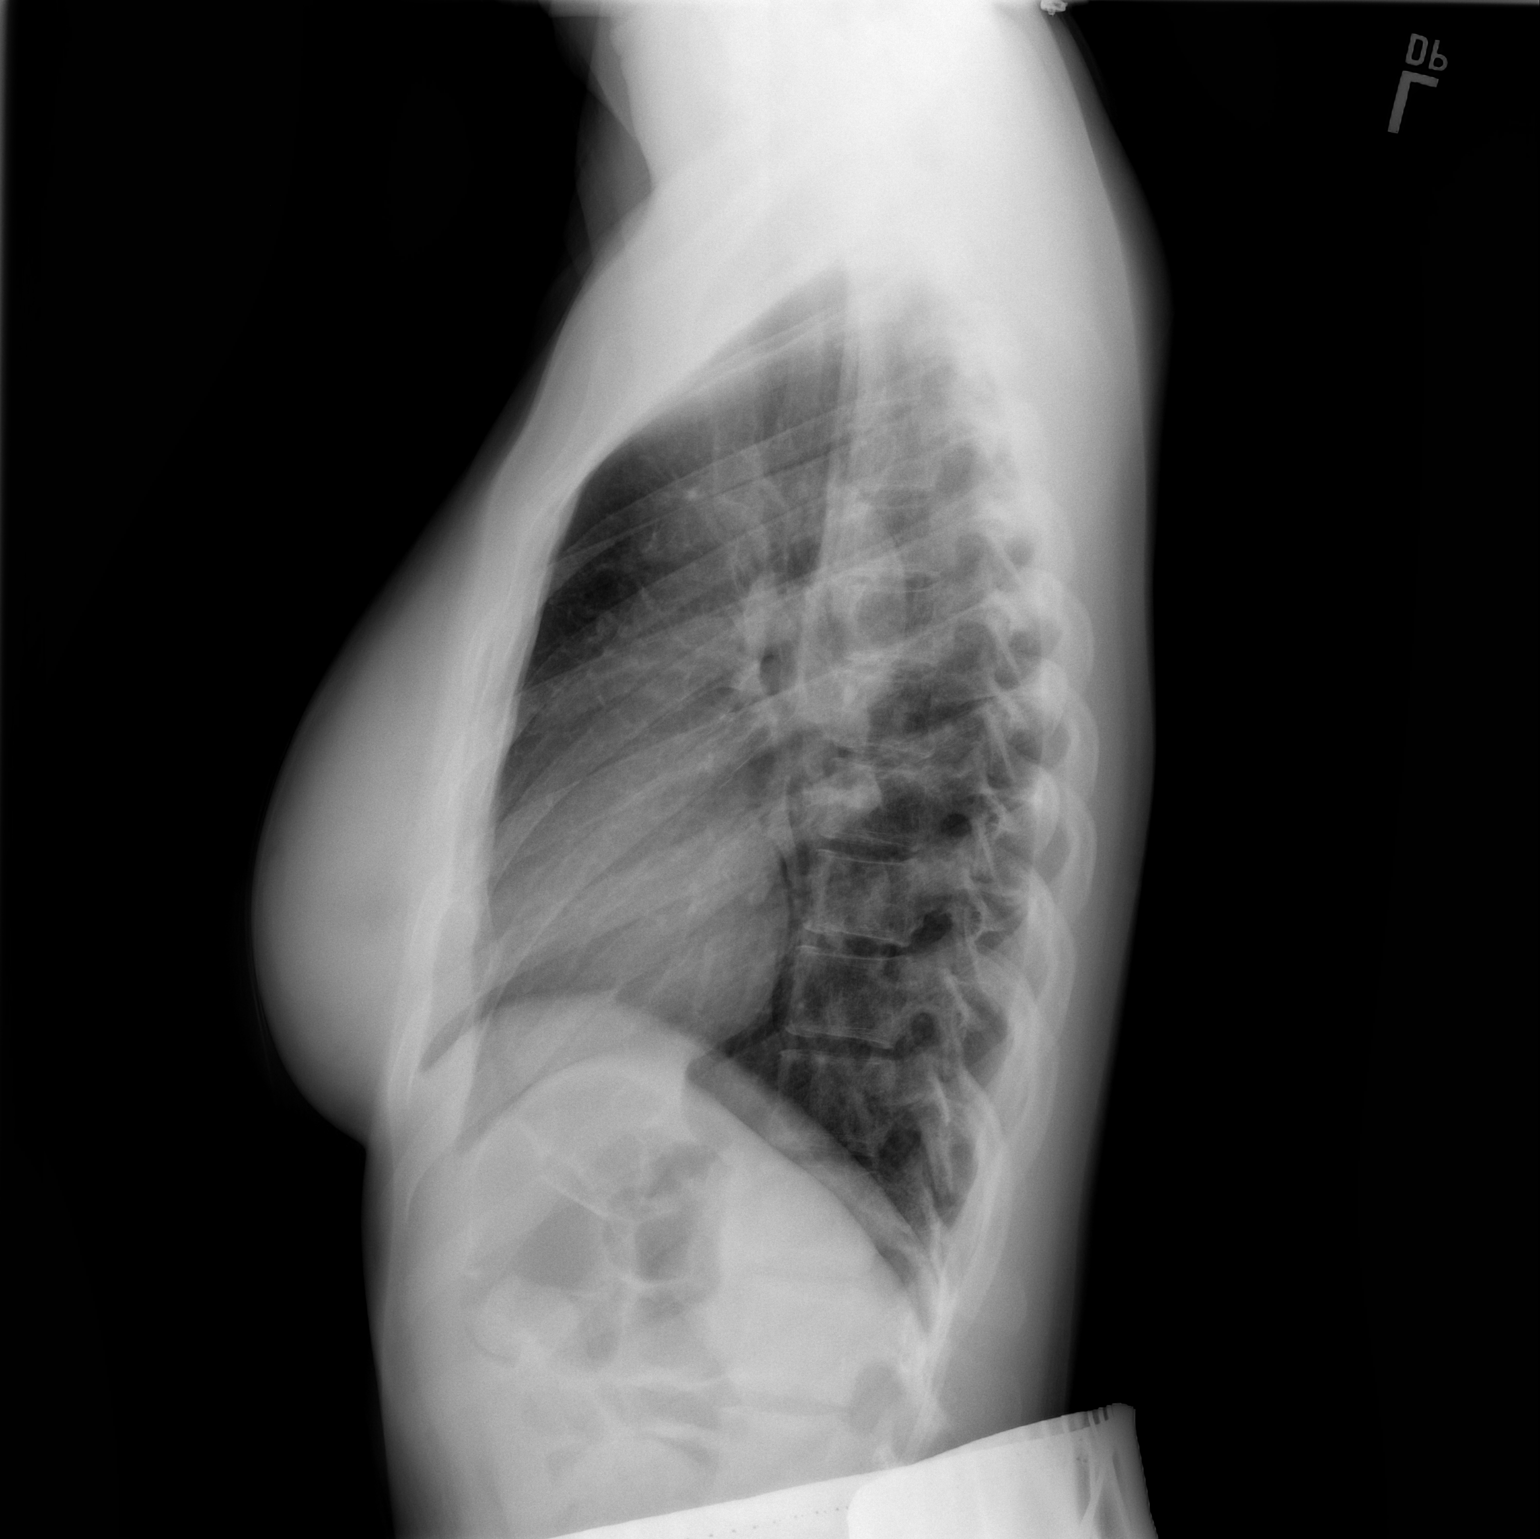

[2 of 2 positions shown; findings below may reference images not displayed]

FINDINGS: There is scarring in the right apex with mild volume loss on the
right. Nodular opacity no longer seen on the right in the upper lung
region. Lungs elsewhere are clear. Heart size and pulmonary
vascularity are normal. No adenopathy. No pneumothorax. No bone
lesions.
IMPRESSION: Scarring right apex with mild volume loss on the right. Lungs
otherwise clear. Heart size normal. No adenopathy evident.

## 2021-07-15 ENCOUNTER — Ambulatory Visit (INDEPENDENT_AMBULATORY_CARE_PROVIDER_SITE_OTHER): Payer: BC Managed Care – PPO | Admitting: Obstetrics & Gynecology

## 2021-07-15 ENCOUNTER — Encounter: Payer: Self-pay | Admitting: Obstetrics & Gynecology

## 2021-07-15 ENCOUNTER — Other Ambulatory Visit (HOSPITAL_COMMUNITY)
Admission: RE | Admit: 2021-07-15 | Discharge: 2021-07-15 | Disposition: A | Discharge status: Home / Self Care | Payer: BC Managed Care – PPO | Source: Ambulatory Visit | Attending: Obstetrics & Gynecology | Admitting: Obstetrics & Gynecology

## 2021-07-15 VITALS — BP 99/64 | HR 82 | Ht 66.0 in | Wt 142.0 lb

## 2021-07-15 DIAGNOSIS — Z8619 Personal history of other infectious and parasitic diseases: Secondary | ICD-10-CM | POA: Insufficient documentation

## 2021-07-15 DIAGNOSIS — Z01419 Encounter for gynecological examination (general) (routine) without abnormal findings: Secondary | ICD-10-CM | POA: Diagnosis not present

## 2021-07-15 DIAGNOSIS — Z113 Encounter for screening for infections with a predominantly sexual mode of transmission: Secondary | ICD-10-CM | POA: Insufficient documentation

## 2021-07-15 NOTE — Progress Notes (Signed)
? ?  WELL-WOMAN EXAMINATION ?Patient name: Pam Clark MRN [id number]  Date of birth: [birth year] ?Chief Complaint:   ?annual exam (Never had POC from +CHL in Jan.) ? ?History of Present Illness:   ?Pam Clark is a 26 y.o. G0P0000  female being seen today for a routine well-woman exam.  ? ?Today she notes: no acute complaints or concerns ? ?Prior Chlamydia, treated, []  TOC today ? ?Contraception: On Depot- currently have some irregular bleeding, but not heavy.  She does note some dysmenorrhea- improved with heating pad. ? ?Patient's last menstrual period was 07/14/2021. ?Denies issues with her menses ?The current method of family planning is Depo-Provera injections.  ? ? ?Last pap 05/2020.  ?Last mammogram: n/a. ?Last colonoscopy: n/a ? ? ?  07/15/2021  ?  1:34 PM  ?Depression screen PHQ 2/9  ?Decreased Interest 0  ?Down, Depressed, Hopeless 0  ?PHQ - 2 Score 0  ?Altered sleeping 0  ?Tired, decreased energy 0  ?Change in appetite 0  ?Feeling bad or failure about yourself  0  ?Trouble concentrating 0  ?Moving slowly or fidgety/restless 0  ?Suicidal thoughts 0  ?PHQ-9 Score 0  ? ? ? ? ?Review of Systems:   ?Pertinent items are noted in HPI ?Denies any headaches, blurred vision, fatigue, shortness of breath, chest pain, abdominal pain, bowel movements, urination, or intercourse unless otherwise stated above. ? ?Pertinent History Reviewed:  ?Reviewed past medical,surgical, social and family history.  ?Reviewed problem list, medications and allergies. ?Physical Assessment:  ? ?Vitals:  ? 07/15/21 1327  ?BP: 99/64  ?Pulse: 82  ?Weight: 142 lb (64.4 kg)  ?Height: 5\' 6"  (1.676 m)  ?Body mass index is 22.92 kg/m?. ?  ?     Physical Examination:  ? General appearance - well appearing, and in no distress ? Mental status - alert, oriented to person, place, and time ? Psych:  She has a normal mood and affect ? Skin - warm and dry, normal color, no suspicious lesions noted ? Chest - effort normal, all lung fields  clear to auscultation bilaterally ? Heart - normal rate and regular rhythm ? Neck:  midline trachea, no thyromegaly or nodules ? Breasts - breasts appear normal, no suspicious masses, no skin or nipple changes or  axillary nodes ? Abdomen - soft, nontender, nondistended, no masses or organomegaly ? Pelvic - VULVA: normal appearing vulva with no masses, tenderness or lesions  VAGINA: normal appearing vagina with normal color and discharge, no lesions  CERVIX: normal appearing cervix without discharge or lesions, no CMT ? UTERUS: uterus is felt to be normal size, shape, consistency and nontender  ? ADNEXA: No adnexal masses or tenderness noted. ? Extremities:  No swelling or varicosities noted ? ?Chaperone: 07/17/21   ? ? ?Assessment & Plan:  ?1) Well-Woman Exam ?-pap up to date, reviewed ASCCP guidelines ? ?2) h/o chlamydia ?[]  TOC today ? ?3) Contraception ?-continue with Depot ?-Questions/concerns were addressed regarding future pregnancy and fertility ? ? ?Meds: No orders of the defined types were placed in this encounter. ? ? ?Follow-up: Return in about 1 year (around 07/16/2022) for Annual. ? ? ?Malachy Mood, DO ?Attending Obstetrician & Gynecologist, Faculty Practice ?Center for , [facility] ? ? ?

## 2021-07-17 LAB — CERVICOVAGINAL ANCILLARY ONLY
Bacterial Vaginitis (gardnerella): NEGATIVE
Candida Glabrata: NEGATIVE
Candida Vaginitis: NEGATIVE
Chlamydia: NEGATIVE
Comment: NEGATIVE
Comment: NEGATIVE
Comment: NEGATIVE
Comment: NEGATIVE
Comment: NEGATIVE
Comment: NORMAL
Neisseria Gonorrhea: NEGATIVE
Trichomonas: NEGATIVE

## 2021-08-15 ENCOUNTER — Encounter: Payer: Self-pay | Admitting: Emergency Medicine

## 2021-08-15 ENCOUNTER — Telehealth: Payer: BC Managed Care – PPO | Admitting: Family Medicine

## 2021-08-15 ENCOUNTER — Telehealth: Payer: BC Managed Care – PPO

## 2021-08-15 DIAGNOSIS — B001 Herpesviral vesicular dermatitis: Secondary | ICD-10-CM

## 2021-08-15 DIAGNOSIS — L700 Acne vulgaris: Secondary | ICD-10-CM

## 2021-08-15 MED ORDER — VALACYCLOVIR HCL 1 G PO TABS: ORAL_TABLET | ORAL | 0 refills | Status: DC

## 2021-08-15 NOTE — Progress Notes (Signed)
Virtual Visit Consent   Pam Clark, you are scheduled for a virtual visit with a [facility] provider today. Just as with appointments in the office, your consent must be obtained to participate. Your consent will be active for this visit and any virtual visit you may have with one of our providers in the next 365 days. If you have a MyChart account, a copy of this consent can be sent to you electronically.  As this is a virtual visit, video technology does not allow for your provider to perform a traditional examination. This may limit your provider's ability to fully assess your condition. If your provider identifies any concerns that need to be evaluated in person or the need to arrange testing (such as labs, EKG, etc.), we will make arrangements to do so. Although advances in technology are sophisticated, we cannot ensure that it will always work on either your end or our end. If the connection with a video visit is poor, the visit may have to be switched to a telephone visit. With either a video or telephone visit, we are not always able to ensure that we have a secure connection.  By engaging in this virtual visit, you consent to the provision of healthcare and authorize for your insurance to be billed (if applicable) for the services provided during this visit. Depending on your insurance coverage, you may receive a charge related to this service.  I need to obtain your verbal consent now. Are you willing to proceed with your visit today? Pam Clark has provided verbal consent on 08/15/2021 for a virtual visit (video or telephone). Pam Curio, FNP  Date: 08/15/2021 4:16 PM  Virtual Visit via Video Note   I, Pam Clark, connected with  Pam Clark  ([id number], 04-21-1995) on 08/15/21 at  4:00 PM EDT by a video-enabled telemedicine application and verified that I am speaking with the correct person using two identifiers.  Location: Patient: Virtual Visit  Location Patient: Home Provider: Virtual Visit Location Provider: Home Office   I discussed the limitations of evaluation and management by telemedicine and the availability of in person appointments. The patient expressed understanding and agreed to proceed.    History of Present Illness: Pam Clark is a 26 y.o. who identifies as a female who was assigned female at birth, and is being seen today for a fever blister on lower lip and acne. She denies being sick lately. She reports allergy to doxycycline. Marland Kitchen  HPI: HPI  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   Bronchiectasis without complication (HCC) 10/09/2019    Allergies:  Allergies  Allergen Reactions   Doxycycline Nausea And Vomiting   Medications:  Current Outpatient Medications:    valACYclovir (VALTREX) 1000 MG tablet, Take 2 tabs now then 2 tabs in 12 hrs and stop., Disp: 4 tablet, Rfl: 0   albuterol (VENTOLIN HFA) 108 (90 Base) MCG/ACT inhaler, Inhale 2 puffs into the lungs every 6 (six) hours as needed for wheezing or shortness of breath., Disp: 8 g, Rfl: 2   CRANBERRY EXTRACT PO, Take by mouth., Disp: , Rfl:    FENUGREEK PO, Take by mouth., Disp: , Rfl:    Ferrous Sulfate (IRON PO), Take 1 tablet by mouth daily., Disp: , Rfl:    nystatin (MYCOSTATIN) 100000 UNIT/ML suspension, Take 5  mLs (500,000 Units total) by mouth 4 (four) times daily., Disp: 60 mL, Rfl: 0  Observations/Objective: Patient is well-developed, well-nourished in no acute distress. In parked car.  Head is normocephalic, atraumatic.  No labored breathing.  Speech is clear and coherent with logical content.  Patient is alert and oriented at baseline.    Assessment and Plan: 1. Herpes labialis  2. Acne vulgaris  Med use and side effects discussed. Use benzoyl peroxide  and hydrocortisone on face. Follow up with pcp as needed.   Follow Up Instructions: I discussed the assessment and treatment plan with the patient. The patient was provided an opportunity to ask questions and all were answered. The patient agreed with the plan and demonstrated an understanding of the instructions.  A copy of instructions were sent to the patient via MyChart unless otherwise noted below.     The patient was advised to call back or seek an in-person evaluation if the symptoms worsen or if the condition fails to improve as anticipated.  Time:  I spent 8 minutes with the patient via telehealth technology discussing the above problems/concerns.    Pam Curio, FNP

## 2021-08-15 NOTE — Patient Instructions (Signed)
Benzoyl Peroxide Cream, Gel or Lotion What is this medication? BENZOYL PEROXIDE (BEN zoe ill per OX ide) treats acne or rosacea. It works by decreasing inflammation and killing or preventing the growth of bacteria on your skin. It belongs to a group of medications called antibiotics. This medicine may be used for other purposes; ask your health care provider or pharmacist if you have questions. COMMON BRAND NAME(S): Acne Medication, Acne-10, Acneclear, Benprox, Benzac, Benzac AC, Benzac W, Benzac-10, Benzac-5, Benzagel, Benzagel-10, Benzagel-5, BenzaShave, BenzEFoam, BenzEFoam Ultra, BenzePrO, Benziq, Benziq LS, BP Cleansing Lotion, BP Gel, BP Topical, BPO, Brevoxyl-4, Brevoxyl-8, Clean&Clear Persa-Gel, Clearasil, Clearasil Ultra, Clearasil Vanishing, Clearplex, Clearplex X, Clearskin, Clinac BPO, [place], [place], [place], [place], EFFACLAR, [place], [place], Lav[place]len-4 Acne Wash Kit, Lavoclen-8 Acne Wash Kit, NeoBenz, NeoBenz Micro, [place] Micro Cream Plus Pack, NeoBenz Micro SD, Neutrogena Acne Cream, OC8, [place], PanOxyl, PanOxyl AQ, PanOxyl Aqua, PanOxyl-10, PanOxyl-5, RE Benzoyl Peroxide, Riax, [place], Seba-Gel, Soluclenz Rx, Theroxide, Triaz, Zoderm, Zoderm Cream, Zoderm Gel What should I tell my care team before I take this medication? They need to know if you have any of these conditions: Large area of burned or damaged skin Lung or breathing disease (asthma) Skin conditions or sensitivity An unusual or allergic reaction to benzoyl peroxide, other medications, foods, dyes, or preservatives Pregnant or trying to get pregnant Breast-feeding How should I use this medication? This medication is for external use only. Do not take by mouth. Wash your hands before and after use. If you are treating your hands, only wash your hands before use. Do not get it in your eyes. If you do, rinse your eyes with plenty of cool tap water. Use it as directed on the prescription label at the same time every  day. Do not use it more often than directed. Use the medication for the full course as directed by your care team, even if you think you are better. Do not stop using it unless your care team tells you to stop it early. Apply a thin film of the medication to the affected area. Talk to your care team about the use of this medication in children. Special care may be needed. Overdosage: If you think you have taken too much of this medicine contact a poison control center or emergency room at once. NOTE: This medicine is only for you. Do not share this medicine with others. What if I miss a dose? If you miss a dose, take it as soon as you can. If it is almost time for your next dose, take only that dose. Do not take double or extra doses. What may interact with this medication? Adapalene Isotretinoin Salicylic acid or sulfur-containing products Topical antibiotics, such as clindamycin or erythromycin Tretinoin This list may not describe all possible interactions. Give your health care provider a list of all the medicines, herbs, non-prescription drugs, or dietary supplements you use. Also tell them if you smoke, drink alcohol, or use illegal drugs. Some items may interact with your medicine. What should I watch for while using this medication? Visit your care team for regular checks on your progress. It may be some time before you see the benefit from this medication. Do not use other products that dry the skin. Examples include abrasive cleaners or products with alcohol in them. Do not use other acne products on the same areas of the skin as this one unless your care team tells you to use both. Avoid eating or drinking foods or drinks that may make skin  redness, flushing, and blushing worse. Examples include spicy foods, alcohol, hot coffee, or hot tea. This medication can make you more sensitive to the sun. Keep out of the sun. If you cannot avoid being in the sun, wear protective clothing and  sunscreen. Do not use sun lamps or tanning beds/booths. This medication may cause white fabric to turn yellow. Wash yellowed fabrics with laundry detergent. What side effects may I notice from receiving this medication? Side effects that you should report to your care team as soon as possible: Allergic reactions--skin rash, itching, hives, swelling of the face, lips, tongue, or throat Burning, itching, crusting, or peeling of treated skinSide effects that usually do not require medical attention (report to your care team if they continue or are bothersome): Mild skin irritation, redness, or dryness Sensitivity to light This list may not describe all possible side effects. Call your doctor for medical advice about side effects. You may report side effects to FDA at 1-800-FDA-1088. Where should I keep my medication? Keep out of the reach of children and pets. Store at room temperature between 15 and 30 degrees C (59 and 86 degrees F). Do not freeze. Protect from light and moisture. Keep the container tightly closed. Avoid exposure to extreme heat. Get rid of medications that are no longer needed or have expired: Take the medication to a medication take-back program. Check with your pharmacy or law enforcement to find a location. If you cannot return the medication, check the label or package insert to see if the medication should be thrown out in the garbage or flushed down the toilet. If you are not sure, ask your care team. If it is safe to put in the trash, take the medication out of the container. Mix the medication with cat litter, dirt, coffee grounds, or other unwanted substance. Seal the mixture in a bag or container. Put it in the trash. NOTE: This sheet is a summary. It may not cover all possible information. If you have questions about this medicine, talk to your doctor, pharmacist, or health care provider.  2023 Elsevier/Gold Standard (2021-01-15 00:00:00) Cold Sore  A cold sore, also  called a fever blister, is a small, fluid-filled sore that forms inside the mouth or on the lips, gums, nose, chin, or cheeks. Cold sores can spread to other parts of the body, such as the eyes, fingers, or genitals. Cold sores can spread from person to person (are contagious) until the sores crust over completely. Most cold sores go away within 2 weeks. What are the causes? Cold sores are caused by an infection from a common type of herpes simplex virus (HSV-1). HSV-1 is closely related to the HSV-2virus, which is the virus that causes genital herpes, but these viruses are not the same. Once a person is infected with HSV-1, the virus remains permanently in the body. HSV-1 is spread from person to person through close contact, such as through kissing, touching the affected area, or sharing personal items such as lip balm, razors, a drinking glass, or eating utensils. What increases the risk? You are more likely to develop this condition if you: Are tired, stressed, or sick. Are menstruating. Are pregnant. Take certain medicines. Are exposed to cold weather or too much sun. What are the signs or symptoms? Symptoms of a cold sore outbreak go through different stages. These are the stages of a cold sore: Tingling, itching, or burning is felt 1-2 days before the outbreak. Fluid-filled blisters appear on the lips, inside the  mouth, on the nose, or on the cheeks. The blisters start to ooze clear fluid. The blisters dry up, and a yellow crust appears in their place. The crust falls off. In some cases, other symptoms can develop during a cold sore outbreak. These can include: Fever. Sore throat. Headache. Muscle aches. Swollen neck glands. How is this diagnosed? This condition is diagnosed based on your medical history and a physical exam. Your health care provider may do a blood test or may swab some fluid from your sore and then examine the swab in the lab. How is this treated? There is no cure  for cold sores or HSV-1. There is also no vaccine for HSV-1. Most cold sores go away on their own without treatment within 2 weeks. Medicines cannot make the infection go away, but your health care provider may prescribe medicines to: Help relieve some of the pain associated with the sores. Work to stop the virus from multiplying. Shorten healing time. Medicines may be in the form of creams, gels, pills, or a shot. Follow these instructions at home: Medicines Take or apply over-the-counter and prescription medicines only as told by your health care provider. Use a cotton-tip swab to apply creams or gels to your sores. Ask your health care provider if you can take lysine supplements. Research has found that lysine may help heal the cold sore faster and prevent outbreaks. Sore care  Do not touch the sores or pick the scabs. Wash your hands often with soap and water for at least 20 seconds. Do not touch your eyes without washing your hands first. Keep the sores clean and dry. If directed, put ice on the sores. To do this: Put ice in a plastic bag. Place a towel between your skin and the bag. Leave the ice on for 20 minutes, 2-3 times a day. Remove the ice if your skin turns bright red. This is very important. If you cannot feel pain, heat, or cold, you have a greater risk of damage to the area. Eating and drinking Eat a soft, bland diet. Avoid eating hot, cold, or salty foods. Use a straw if it hurts to drink out of a glass. Eat foods that are rich in lysine, such as meat, fish, and dairy products. Avoid sugary foods, chocolates, nuts, and grains. These foods are rich in a nutrient called arginine, which can cause the virus to multiply. Lifestyle Do not kiss, have oral sex, or share personal items until your sores heal. Stress, poor sleep, and being out in the sun can trigger outbreaks. Make sure you: Do activities that help you relax, such as deep breathing exercises or meditation. Get  enough sleep. Apply sunscreen on your lips before you go out in the sun. Contact a health care provider if: You have symptoms for more than 2 weeks. You have pus coming from the sores. You have redness that is spreading. You have pain or irritation in your eye. You get sores on your genitals. Your sores do not heal within 2 weeks. You have frequent cold sore outbreaks. Get help right away if: You have a fever and your symptoms suddenly get worse. You have a headache and confusion. You have tiredness (fatigue) or loss of appetite. You have a stiff neck or sensitivity to light. Summary A cold sore, also called a fever blister, is a small, fluid-filled sore that forms inside the mouth or on the lips, gums, nose, chin, or cheeks. Most cold sores go away on their own without  treatment within 2 weeks. Your health care provider may prescribe medicines to help relieve some of the pain, work to stop the virus from multiplying, and shorten healing time. Wash your hands often with soap and water for at least 20 seconds. Do not touch your eyes without washing your hands first. Do not kiss, have oral sex, or share personal items until your sores heal. Contact a health care provider if your sores do not heal within 2 weeks. This information is not intended to replace advice given to you by your health care provider. Make sure you discuss any questions you have with your health care provider. Document Revised: 12/25/2020 Document Reviewed: 12/25/2020 Elsevier Patient Education  [place].

## 2021-09-09 ENCOUNTER — Ambulatory Visit: Payer: BC Managed Care – PPO

## 2021-09-21 ENCOUNTER — Telehealth: Payer: BC Managed Care – PPO | Admitting: Family

## 2021-09-21 ENCOUNTER — Telehealth: Payer: BC Managed Care – PPO

## 2021-09-21 DIAGNOSIS — J029 Acute pharyngitis, unspecified: Secondary | ICD-10-CM | POA: Diagnosis not present

## 2021-09-21 MED ORDER — AMOXICILLIN 500 MG PO CAPS: 500.0000 mg | ORAL_CAPSULE | Freq: Two times a day (BID) | ORAL | 0 refills | Status: AC

## 2021-09-21 MED ORDER — CETIRIZINE HCL 10 MG PO TABS: 10.0000 mg | ORAL_TABLET | Freq: Every day | ORAL | 11 refills | Status: DC

## 2021-09-21 MED ORDER — FLUTICASONE PROPIONATE 50 MCG/ACT NA SUSP: 2.0000 | Freq: Every day | NASAL | 6 refills | Status: DC

## 2021-09-22 ENCOUNTER — Telehealth: Payer: BC Managed Care – PPO

## 2021-09-22 ENCOUNTER — Telehealth: Payer: BC Managed Care – PPO | Admitting: Physician Assistant

## 2021-09-22 DIAGNOSIS — J020 Streptococcal pharyngitis: Secondary | ICD-10-CM

## 2021-10-01 ENCOUNTER — Telehealth: Payer: Self-pay

## 2021-10-01 NOTE — Telephone Encounter (Signed)
Pt called and stated that she stopped taking the Depo and has some questions about getting pregnant.  Would like for a nurse to call her.

## 2021-10-01 NOTE — Telephone Encounter (Signed)
Patient with questions regarding conceiving after taking depo and if there is anything she needs to start doing in order to conceive.  Informed patient her last depo was in March so it could take a month or two for her cycle to start.  Advised to start tracking period and to start taking a multivitamin or prenatal daily.  Also inquired about infertility testing.  Informed testing is not typically done unless she has been actively trying to conceive for over a year.  Pt verbalized understanding with no further questions.

## 2021-10-12 IMAGING — DX DG CHEST 2V
2 series · 2 of 2 positions shown · non-contrast
Comparison: Chest x-ray 02/27/2020, CT chest 12/18/2019

CLINICAL DATA: Chest pain, status post right upper lobectomy.

EXAM:
CHEST - 2 VIEW

[chest pa]
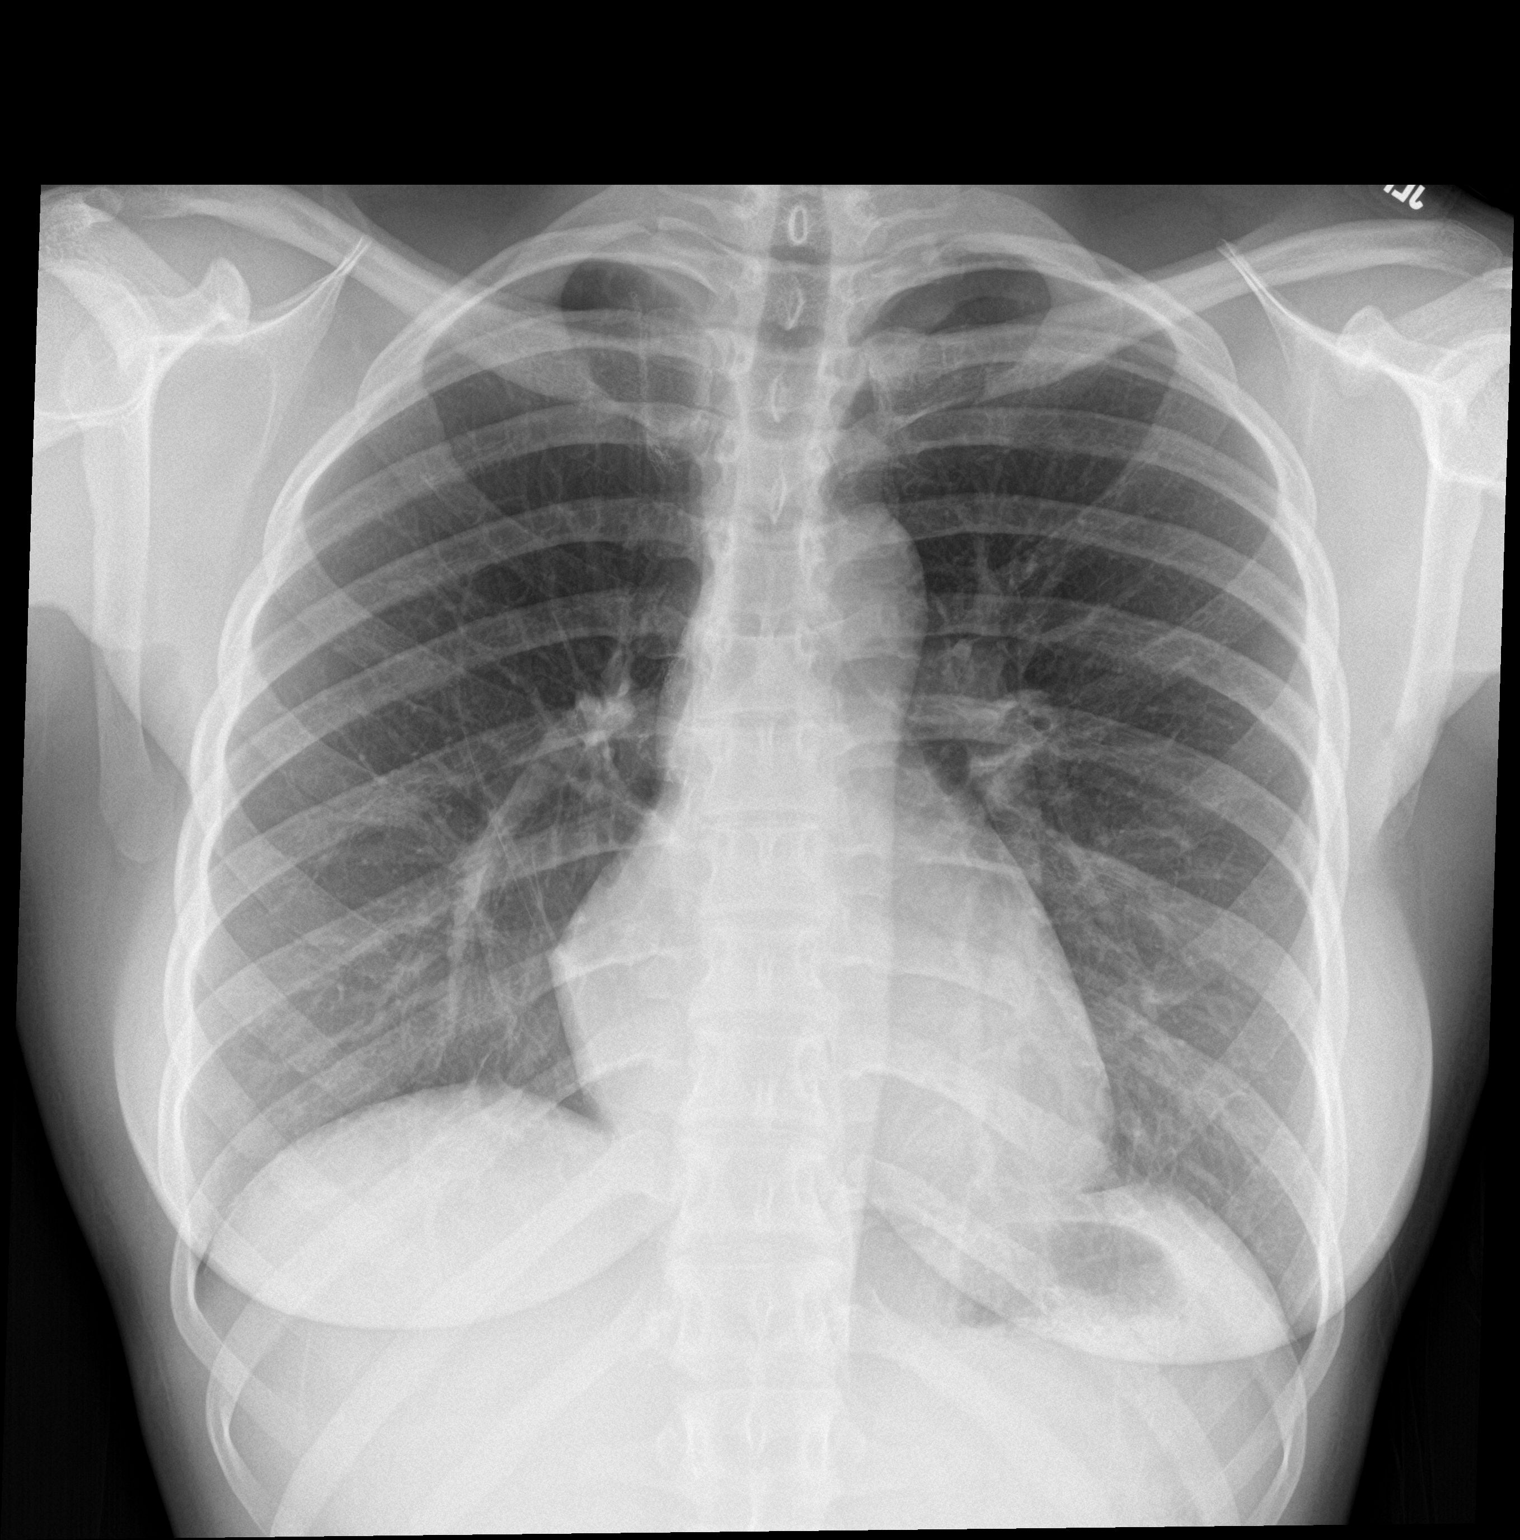

[chest lat]
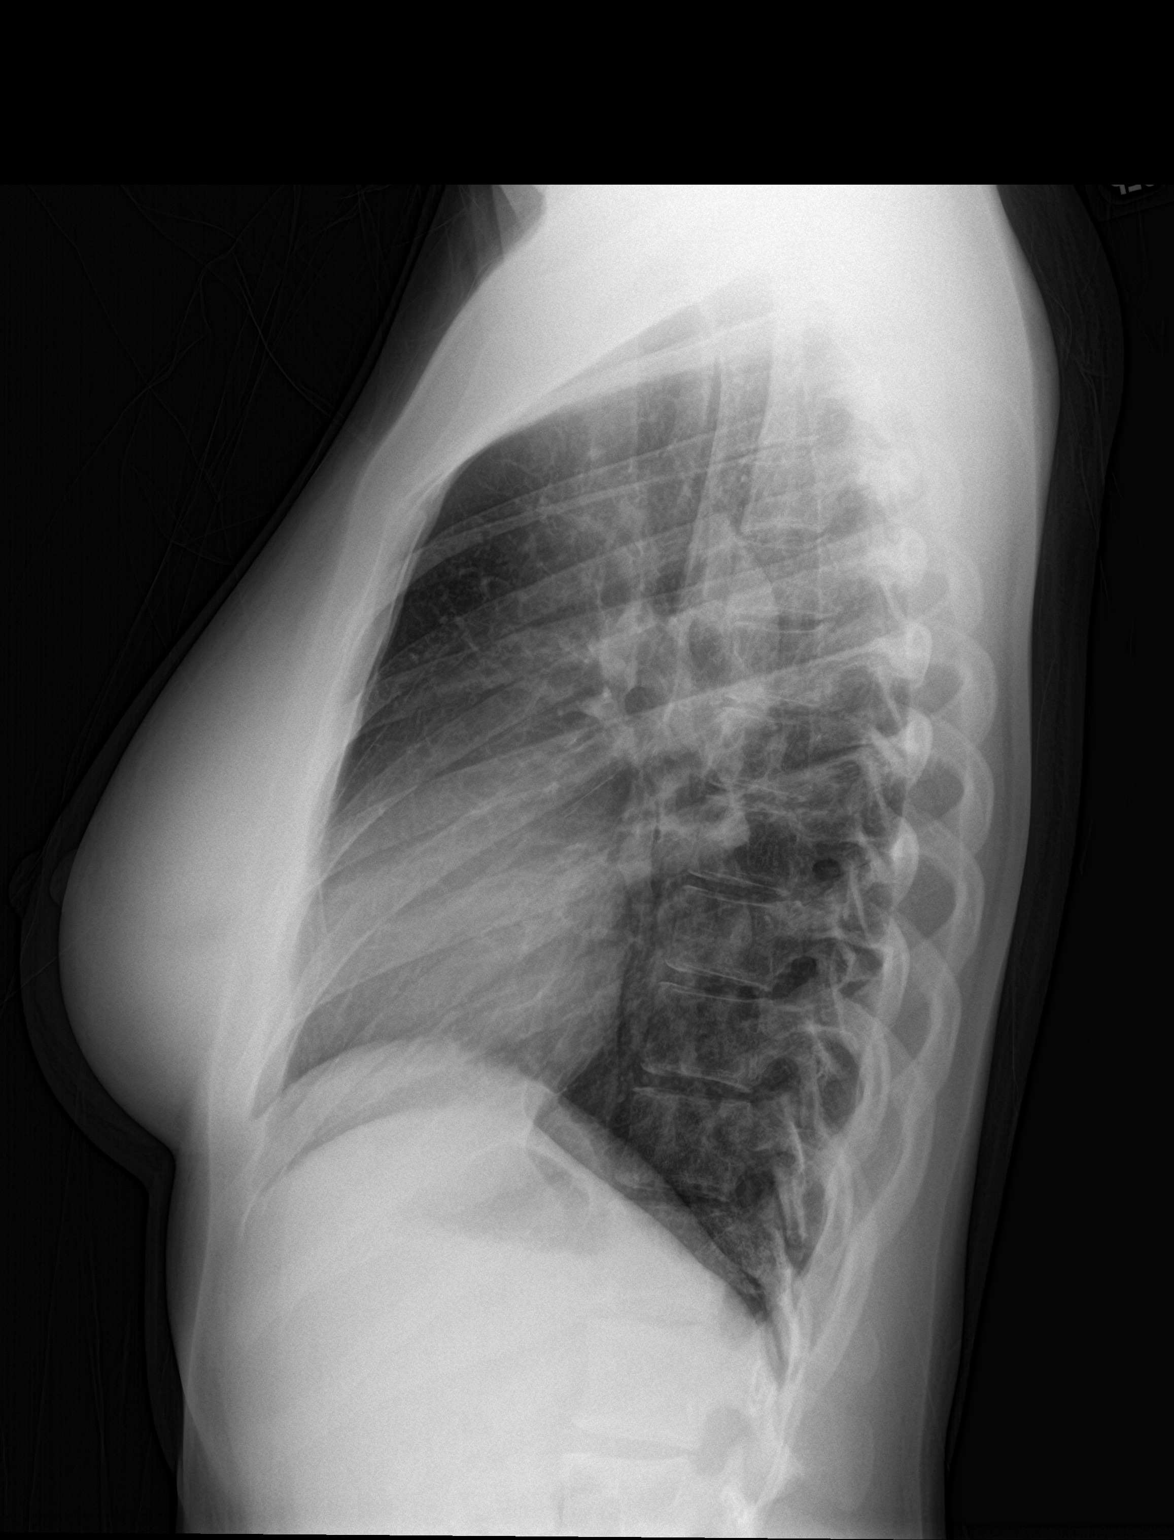

[2 of 2 positions shown; findings below may reference images not displayed]

FINDINGS: The heart size and mediastinal contours are within normal limits.

Surgical changes related to right upper lobectomy. No focal
consolidation. No pulmonary edema. No pleural effusion. No
pneumothorax.

No acute osseous abnormality.
IMPRESSION: No active cardiopulmonary disease.

## 2021-10-15 ENCOUNTER — Telehealth: Payer: BC Managed Care – PPO | Admitting: Physician Assistant

## 2021-10-15 DIAGNOSIS — N946 Dysmenorrhea, unspecified: Secondary | ICD-10-CM

## 2021-10-15 DIAGNOSIS — N920 Excessive and frequent menstruation with regular cycle: Secondary | ICD-10-CM

## 2021-10-15 MED ORDER — IBUPROFEN 600 MG PO TABS: 600.0000 mg | ORAL_TABLET | Freq: Three times a day (TID) | ORAL | 0 refills | Status: DC | PRN

## 2021-10-15 NOTE — Progress Notes (Signed)
Virtual Visit Consent   Pam Clark, you are scheduled for a virtual visit with a [facility] provider today. Just as with appointments in the office, your consent must be obtained to participate. Your consent will be active for this visit and any virtual visit you may have with one of our providers in the next 365 days. If you have a MyChart account, a copy of this consent can be sent to you electronically.  As this is a virtual visit, video technology does not allow for your provider to perform a traditional examination. This may limit your provider's ability to fully assess your condition. If your provider identifies any concerns that need to be evaluated in person or the need to arrange testing (such as labs, EKG, etc.), we will make arrangements to do so. Although advances in technology are sophisticated, we cannot ensure that it will always work on either your end or our end. If the connection with a video visit is poor, the visit may have to be switched to a telephone visit. With either a video or telephone visit, we are not always able to ensure that we have a secure connection.  By engaging in this virtual visit, you consent to the provision of healthcare and authorize for your insurance to be billed (if applicable) for the services provided during this visit. Depending on your insurance coverage, you may receive a charge related to this service.  I need to obtain your verbal consent now. Are you willing to proceed with your visit today? Baylin Cabal has provided verbal consent on 10/15/2021 for a virtual visit (video or telephone). [name], PA-C  Date: 10/15/2021 9:22 AM  Virtual Visit via Video Note   I, [name], connected with  Pam Clark  ([id number], 02/05/1996) on 10/15/21 at  9:15 AM EDT by a video-enabled telemedicine application and verified that I am speaking with the correct person using two identifiers.  Location: Patient:  Virtual Visit Location Patient: Home Provider: Virtual Visit Location Provider: Home Office   I discussed the limitations of evaluation and management by telemedicine and the availability of in person appointments. The patient expressed understanding and agreed to proceed.    History of Present Illness: Pam Clark is a 26 y.o. who identifies as a female who was assigned female at birth, and is being seen today for menstrual cycle issues. Menstrual started yesterday and was normal, went to work and developed some lightheadedness and dizziness. Got home and noticed her menstrual was much heavier than normal. Reports she wore an adult diaper last night and had a lot of blood in it overnight. She is having lower abdominal cramping and just feels fatigued. Does not think she can work today. She did also just come off depo provera last month. This is first true menstrual cycle since stopping.    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   Bronchiectasis without complication (HCC) 10/09/2019    Allergies:  Allergies  Allergen Reactions   Doxycycline Nausea And Vomiting   Medications:  Current Outpatient Medications:    ibuprofen (ADVIL) 600 MG tablet, Take 1 tablet (600 mg total) by mouth every 8 (eight) hours as needed., Disp: 30 tablet, Rfl: 0   albuterol (VENTOLIN HFA) 108 (90 Base) MCG/ACT inhaler, Inhale 2 puffs into the lungs  every 6 (six) hours as needed for wheezing or shortness of breath., Disp: 8 g, Rfl: 2   cetirizine (ZYRTEC) 10 MG tablet, Take 1 tablet (10 mg total) by mouth daily., Disp: 30 tablet, Rfl: 11   CRANBERRY EXTRACT PO, Take by mouth., Disp: , Rfl:    FENUGREEK PO, Take by mouth., Disp: , Rfl:    Ferrous Sulfate (IRON PO), Take 1 tablet by mouth daily., Disp: , Rfl:    fluticasone (FLONASE) 50  MCG/ACT nasal spray, Place 2 sprays into both nostrils daily., Disp: 16 g, Rfl: 6   nystatin (MYCOSTATIN) 100000 UNIT/ML suspension, Take 5 mLs (500,000 Units total) by mouth 4 (four) times daily., Disp: 60 mL, Rfl: 0   valACYclovir (VALTREX) 1000 MG tablet, Take 2 tabs now then 2 tabs in 12 hrs and stop., Disp: 4 tablet, Rfl: 0  Observations/Objective: Patient is well-developed, well-nourished in no acute distress.  Resting comfortably at home.  Head is normocephalic, atraumatic.  No labored breathing.  Speech is clear and coherent with logical content.  Patient is alert and oriented at baseline.    Assessment and Plan: 1. Menorrhagia with regular cycle - ibuprofen (ADVIL) 600 MG tablet; Take 1 tablet (600 mg total) by mouth every 8 (eight) hours as needed.  Dispense: 30 tablet; Refill: 0  2. Dysmenorrhea  - Ibuprofen provided - Epsom salt soaks and heating pad - Discussed Magnesium for cramping - Stay hydrated - Rest as needed - If heavy bleeding continues for more than 3 days she needs to be seen in person ASAP   Follow Up Instructions: I discussed the assessment and treatment plan with the patient. The patient was provided an opportunity to ask questions and all were answered. The patient agreed with the plan and demonstrated an understanding of the instructions.  A copy of instructions were sent to the patient via MyChart unless otherwise noted below.    The patient was advised to call back or seek an in-person evaluation if the symptoms worsen or if the condition fails to improve as anticipated.  Time:  I spent 12 minutes with the patient via telehealth technology discussing the above problems/concerns.    [name], PA-C

## 2021-10-15 NOTE — Patient Instructions (Signed)
Pam Clark, thank you for joining [name], PA-C for today's virtual visit.  While this provider is not your primary care provider (PCP), if your PCP is located in our provider database this encounter information will be shared with them immediately following your visit.  Consent: (Patient) Pam Clark provided verbal consent for this virtual visit at the beginning of the encounter.  Current Medications:  Current Outpatient Medications:    ibuprofen (ADVIL) 600 MG tablet, Take 1 tablet (600 mg total) by mouth every 8 (eight) hours as needed., Disp: 30 tablet, Rfl: 0   albuterol (VENTOLIN HFA) 108 (90 Base) MCG/ACT inhaler, Inhale 2 puffs into the lungs every 6 (six) hours as needed for wheezing or shortness of breath., Disp: 8 g, Rfl: 2   cetirizine (ZYRTEC) 10 MG tablet, Take 1 tablet (10 mg total) by mouth daily., Disp: 30 tablet, Rfl: 11   CRANBERRY EXTRACT PO, Take by mouth., Disp: , Rfl:    FENUGREEK PO, Take by mouth., Disp: , Rfl:    Ferrous Sulfate (IRON PO), Take 1 tablet by mouth daily., Disp: , Rfl:    fluticasone (FLONASE) 50 MCG/ACT nasal spray, Place 2 sprays into both nostrils daily., Disp: 16 g, Rfl: 6   nystatin (MYCOSTATIN) 100000 UNIT/ML suspension, Take 5 mLs (500,000 Units total) by mouth 4 (four) times daily., Disp: 60 mL, Rfl: 0   valACYclovir (VALTREX) 1000 MG tablet, Take 2 tabs now then 2 tabs in 12 hrs and stop., Disp: 4 tablet, Rfl: 0   Medications ordered in this encounter:  Meds ordered this encounter  Medications   ibuprofen (ADVIL) 600 MG tablet    Sig: Take 1 tablet (600 mg total) by mouth every 8 (eight) hours as needed.    Dispense:  30 tablet    Refill:  0    Order Specific Question:   Supervising Provider    Answer:   Hyacinth Meeker, BRIAN [[id number]]     *If you need refills on other medications prior to your next appointment, please contact your pharmacy*  Follow-Up: Call back or seek an in-person evaluation if the symptoms  worsen or if the condition fails to improve as anticipated.  Other Instructions Menorrhagia Menorrhagia is when your monthly periods are heavy or last longer than normal. If you have this condition, bleeding and cramping may make it hard for you to do your daily activities. What are the causes? Common causes of this condition include: Growths in the womb (uterus). These are polyps or fibroids. These growths are not cancer. Problems with two hormones called estrogen and progesterone. One of the ovaries not releasing an egg during one or more months. A problem with the thyroid gland. Having a device for birth control (IUD). Side effects of some medicines, such as NSAIDs or blood thinners. A disorder that stops the blood from clotting normally. What increases the risk? You are more likely to have this condition if you have cancer of the womb. What are the signs or symptoms? Having to change your pad or tampon every 1-2 hours because it is soaked. Needing to use pads and tampons at the same time because of heavy bleeding. Needing to wake up to change your pads or tampons during the night. Passing blood clots larger than 1 inch (2.5 cm) in size. Having bleeding that lasts for more than 7 days. Having symptoms of low iron levels (anemia), such as feeling tired or having shortness of breath. How is this treated? You may not need  to be treated for this condition. But if you need treatment, you may be given medicines: To reduce bleeding during your period. These include birth control medicines. To make your blood thick. This slows bleeding. To reduce swelling. Medicines that do this include ibuprofen. That have a hormone called progestin. That make the ovaries stop working for a short time. To treat low iron levels. You will be given iron pills if you have this condition. If medicines do not work, surgery may be done. Surgery may be done to: Remove a part of the lining of the womb. This lining  is called the endometrium. This reduces bleeding during a period. Remove growths in the womb. These may be polyps or fibroids. Remove the entire lining of the womb. Remove the womb entirely. This procedure is called a hysterectomy. Follow these instructions at home: Medicines Take over-the-counter and prescription medicines only as told by your doctor. This includes iron pills. Do not change or switch medicines without asking your doctor. Do not take aspirin or medicines that contain aspirin 1 week before or during your period. Aspirin may make bleeding worse. Managing constipation Iron pills may cause trouble pooping (constipation). To prevent or treat problems when pooping, you may need to: Drink enough fluid to keep your pee (urine) pale yellow. Take over-the-counter or prescription medicines. Eat foods that are high in fiber. These include beans, whole grains, and fresh fruits and vegetables. Limit foods that are high in fat and sugar. These include fried or sweet foods. General instructions If you need to change your pad or tampon more than once every 2 hours, limit your activity until the bleeding stops. Eat healthy meals and foods that are high in iron. Foods that have a lot of iron include: Leafy green vegetables. Meat. Liver. Eggs. Whole-grain breads and cereals. Do not try to lose weight until your heavy bleeding has stopped and you have normal amounts of iron in your blood. If you need to lose weight, work with your doctor. Keep all follow-up visits. Contact a doctor if: You soak through a pad or tampon every 1 or 2 hours, and this happens every time you have a period. You need to use pads and tampons at the same time because you are bleeding so much. You are taking medicine, and: You feel like you may vomit. You vomit. You have watery poop (diarrhea). You have other problems that may be related to the medicine you are taking. Get help right away if: You soak through more  than a pad or tampon in 1 hour. You pass clots bigger than 1 inch (2.5 cm) wide. You feel short of breath. You feel like your heart is beating too fast. You feel dizzy or you faint. You feel very weak or tired. Summary Menorrhagia is when your menstrual periods are heavy or last longer than normal. You may not need to be treated for this condition. If you need treatment, you may be given medicines or have surgery. Take over-the-counter and prescription medicines only as told by your doctor. This includes iron pills. Get help right away if you soak through more than a pad or tampon in 1 hour or you pass large clots. Also, get help right away if you feel dizzy, short of breath, or very weak or tired. This information is not intended to replace advice given to you by your health care provider. Make sure you discuss any questions you have with your health care provider. Document Revised: 11/28/2019 Document Reviewed: 11/28/2019  Elsevier Patient Education  [place].    If you have been instructed to have an in-person evaluation today at a local Urgent Care facility, please use the link below. It will take you to a list of all of our available [facility], including address, phone number and hours of operation. Please do not delay care.  [facility]  If you or a family member do not have a primary care provider, use the link below to schedule a visit and establish care. When you choose a [facility] primary care physician or advanced practice provider, you gain a long-term partner in health. Find a Primary Care Provider  Learn more about [facility]'s in-office and virtual care options: [facility]

## 2022-01-14 ENCOUNTER — Telehealth: Payer: BC Managed Care – PPO | Admitting: Nurse Practitioner

## 2022-01-14 DIAGNOSIS — L2084 Intrinsic (allergic) eczema: Secondary | ICD-10-CM

## 2022-01-14 MED ORDER — PREDNISONE 20 MG PO TABS: 40.0000 mg | ORAL_TABLET | Freq: Every day | ORAL | 0 refills | Status: AC

## 2022-01-14 NOTE — Progress Notes (Signed)
Virtual Visit Consent   Pam Clark, you are scheduled for a virtual visit with [name], [place], a [facility] provider, today.     Just as with appointments in the office, your consent must be obtained to participate.  Your consent will be active for this visit and any virtual visit you may have with one of our providers in the next 365 days.     If you have a MyChart account, a copy of this consent can be sent to you electronically.  All virtual visits are billed to your insurance company just like a traditional visit in the office.    As this is a virtual visit, video technology does not allow for your provider to perform a traditional examination.  This may limit your provider's ability to fully assess your condition.  If your provider identifies any concerns that need to be evaluated in person or the need to arrange testing (such as labs, EKG, etc.), we will make arrangements to do so.     Although advances in technology are sophisticated, we cannot ensure that it will always work on either your end or our end.  If the connection with a video visit is poor, the visit may have to be switched to a telephone visit.  With either a video or telephone visit, we are not always able to ensure that we have a secure connection.     I need to obtain your verbal consent now.   Are you willing to proceed with your visit today? YES   Clyde Upshaw has provided verbal consent on 01/14/2022 for a virtual visit (video or telephone).   [name], FNP   Date: 01/14/2022 9:54 AM   Virtual Visit via Video Note   I, [name], connected with Pam Clark ([id number], 06-01-1995) on 01/14/22 at 10:00 AM EDT by a video-enabled telemedicine application and verified that I am speaking with the correct person using two identifiers.  Location: Patient: Virtual Visit Location Patient: Home Provider: Virtual Visit Location Provider: Mobile   I  discussed the limitations of evaluation and management by telemedicine and the availability of in person appointments. The patient expressed understanding and agreed to proceed.    History of Present Illness: Pam Clark is a 26 y.o. who identifies as a female who was assigned female at birth, and is being seen today for rash.  HPI: Patient states that the skin on her face is breaking out with acne. She had slight acne when she was younger. She says it is not like pimples but more like a red rash. Started about 1 week ago. Has gotten worse and has spread from above eyebrows down face and on her chest. Itches. She deneis using new soaps , detergent sor creams on area.     Review of Systems  Constitutional:  Negative for diaphoresis and weight loss.  Eyes:  Negative for blurred vision, double vision and pain.  Respiratory:  Negative for shortness of breath.   Cardiovascular:  Negative for chest pain, palpitations, orthopnea and leg swelling.  Gastrointestinal:  Negative for abdominal pain.  Skin:  Negative for rash.  Neurological:  Negative for dizziness, sensory change, loss of consciousness, weakness and headaches.  Endo/Heme/Allergies:  Negative for polydipsia. Does not bruise/bleed easily.  Psychiatric/Behavioral:  Negative for memory loss. The patient does not have insomnia.   All other systems reviewed and are negative.   Problems:  Patient Active Problem List   Diagnosis Date Noted  Chlamydia 04/21/2021   Nexplanon removal 04/16/2021   Screen for STD (sexually transmitted disease) 04/16/2021   S/P lobectomy of lung 01/29/2020   Bronchiectasis without complication (HCC) 10/09/2019    Allergies:  Allergies  Allergen Reactions   Doxycycline Nausea And Vomiting   Medications:  Current Outpatient Medications:    albuterol (VENTOLIN HFA) 108 (90 Base) MCG/ACT inhaler, Inhale 2 puffs into the lungs every 6 (six) hours as needed for wheezing or shortness of breath., Disp:  8 g, Rfl: 2   cetirizine (ZYRTEC) 10 MG tablet, Take 1 tablet (10 mg total) by mouth daily., Disp: 30 tablet, Rfl: 11   CRANBERRY EXTRACT PO, Take by mouth., Disp: , Rfl:    FENUGREEK PO, Take by mouth., Disp: , Rfl:    Ferrous Sulfate (IRON PO), Take 1 tablet by mouth daily., Disp: , Rfl:    fluticasone (FLONASE) 50 MCG/ACT nasal spray, Place 2 sprays into both nostrils daily., Disp: 16 g, Rfl: 6   ibuprofen (ADVIL) 600 MG tablet, Take 1 tablet (600 mg total) by mouth every 8 (eight) hours as needed., Disp: 30 tablet, Rfl: 0   nystatin (MYCOSTATIN) 100000 UNIT/ML suspension, Take 5 mLs (500,000 Units total) by mouth 4 (four) times daily., Disp: 60 mL, Rfl: 0   valACYclovir (VALTREX) 1000 MG tablet, Take 2 tabs now then 2 tabs in 12 hrs and stop., Disp: 4 tablet, Rfl: 0  Observations/Objective: Patient is well-developed, well-nourished in no acute distress.  Resting comfortably  at home.  Head is normocephalic, atraumatic.  No labored breathing.  Speech is clear and coherent with logical content.  Patient is alert and oriented at baseline.  Blackened raised lesions on face and chest  Assessment and Plan:  Pam Clark in today with chief complaint of No chief complaint on file.   1. Intrinsic atopic dermatitis Avoid rubbing area No scratching' clean face with dial soap.  - predniSONE (DELTASONE) 20 MG tablet; Take 2 tablets (40 mg total) by mouth daily with breakfast for 5 days. 2 po daily for 5 days  Dispense: 10 tablet; Refill: 0     Follow Up Instructions: I discussed the assessment and treatment plan with the patient. The patient was provided an opportunity to ask questions and all were answered. The patient agreed with the plan and demonstrated an understanding of the instructions.  A copy of instructions were sent to the patient via MyChart.  The patient was advised to call back or seek an in-person evaluation if the symptoms worsen or if the condition fails to  improve as anticipated.  Time:  I spent 10 minutes with the patient via telehealth technology discussing the above problems/concerns.    [name], FNP

## 2022-01-25 ENCOUNTER — Telehealth: Payer: BC Managed Care – PPO | Admitting: Family

## 2022-01-25 DIAGNOSIS — B3731 Acute candidiasis of vulva and vagina: Secondary | ICD-10-CM | POA: Diagnosis not present

## 2022-01-25 MED ORDER — FLUCONAZOLE 150 MG PO TABS: 150.0000 mg | ORAL_TABLET | ORAL | 0 refills | Status: DC | PRN

## 2022-01-25 NOTE — Progress Notes (Signed)
Virtual Visit Consent   Pam Clark, you are scheduled for a virtual visit with a [facility] provider today. Just as with appointments in the office, your consent must be obtained to participate. Your consent will be active for this visit and any virtual visit you may have with one of our providers in the next 365 days. If you have a MyChart account, a copy of this consent can be sent to you electronically.  As this is a virtual visit, video technology does not allow for your provider to perform a traditional examination. This may limit your provider's ability to fully assess your condition. If your provider identifies any concerns that need to be evaluated in person or the need to arrange testing (such as labs, EKG, etc.), we will make arrangements to do so. Although advances in technology are sophisticated, we cannot ensure that it will always work on either your end or our end. If the connection with a video visit is poor, the visit may have to be switched to a telephone visit. With either a video or telephone visit, we are not always able to ensure that we have a secure connection.  By engaging in this virtual visit, you consent to the provision of healthcare and authorize for your insurance to be billed (if applicable) for the services provided during this visit. Depending on your insurance coverage, you may receive a charge related to this service.  I need to obtain your verbal consent now. Are you willing to proceed with your visit today? Barb Shear has provided verbal consent on 01/25/2022 for a virtual visit (video or telephone). Pam Rodney, FNP  Date: 01/25/2022 6:16 PM  Virtual Visit via Video Note   I, Pam Clark, connected with  Pam Clark  ([id number], 1995/10/04) on 01/25/22 at  6:15 PM EDT by a video-enabled telemedicine application and verified that I am speaking with the correct person using two identifiers.  Location: Patient: Virtual Visit  Location Patient: Other: car Provider: Virtual Visit Location Provider: Home Office   I discussed the limitations of evaluation and management by telemedicine and the availability of in person appointments. The patient expressed understanding and agreed to proceed.    History of Present Illness: Pam Clark is a 26 y.o. who identifies as a female who was assigned female at birth, and is being seen today for vaginal discharge.  HPI: Vaginal Discharge The patient's primary symptoms include a genital odor and vaginal discharge. The patient's pertinent negatives include no genital itching, pelvic pain or vaginal bleeding. This is a new problem. The current episode started in the past 7 days. The problem occurs intermittently. The patient is experiencing no pain. The vaginal discharge was watery and milky. She has not been passing clots. She has not been passing tissue. She has tried antibiotics for the symptoms. The treatment provided mild relief.    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   Bronchiectasis without complication (HCC) 10/09/2019    Allergies:  Allergies  Allergen Reactions   Doxycycline Nausea And Vomiting   Medications:  Current Outpatient Medications:    fluconazole (DIFLUCAN) 150 MG tablet, Take 1 tablet (150 mg total) by mouth every three (3) days as needed., Disp: 2 tablet, Rfl: 0   albuterol (VENTOLIN HFA) 108 (90 Base) MCG/ACT inhaler, Inhale 2 puffs  into the lungs every 6 (six) hours as needed for wheezing or shortness of breath., Disp: 8 g, Rfl: 2   cetirizine (ZYRTEC) 10 MG tablet, Take 1 tablet (10 mg total) by mouth daily., Disp: 30 tablet, Rfl: 11   CRANBERRY EXTRACT PO, Take by mouth., Disp: , Rfl:    FENUGREEK PO, Take by mouth., Disp: , Rfl:    Ferrous Sulfate (IRON PO), Take 1  tablet by mouth daily., Disp: , Rfl:    fluticasone (FLONASE) 50 MCG/ACT nasal spray, Place 2 sprays into both nostrils daily., Disp: 16 g, Rfl: 6   ibuprofen (ADVIL) 600 MG tablet, Take 1 tablet (600 mg total) by mouth every 8 (eight) hours as needed., Disp: 30 tablet, Rfl: 0   nystatin (MYCOSTATIN) 100000 UNIT/ML suspension, Take 5 mLs (500,000 Units total) by mouth 4 (four) times daily., Disp: 60 mL, Rfl: 0   valACYclovir (VALTREX) 1000 MG tablet, Take 2 tabs now then 2 tabs in 12 hrs and stop., Disp: 4 tablet, Rfl: 0  Observations/Objective: Patient is well-developed, well-nourished in no acute distress.  Resting comfortably   Head is normocephalic, atraumatic.  No labored breathing.  Speech is clear and coherent with logical content.  Patient is alert and oriented at baseline.    Assessment and Plan: 1. Vagina, candidiasis - fluconazole (DIFLUCAN) 150 MG tablet; Take 1 tablet (150 mg total) by mouth every three (3) days as needed.  Dispense: 2 tablet; Refill: 0  Keep clean and dry AZO as needed  Avoid irritants Follow up if symptoms worsen or do not improve    Follow Up Instructions: I discussed the assessment and treatment plan with the patient. The patient was provided an opportunity to ask questions and all were answered. The patient agreed with the plan and demonstrated an understanding of the instructions.  A copy of instructions were sent to the patient via MyChart unless otherwise noted below.     The patient was advised to call back or seek an in-person evaluation if the symptoms worsen or if the condition fails to improve as anticipated.  Time:  I spent 7 minutes with the patient via telehealth technology discussing the above problems/concerns.    Pam Dun, FNP

## 2022-02-13 ENCOUNTER — Encounter: Payer: Self-pay | Admitting: Obstetrics & Gynecology

## 2022-02-13 ENCOUNTER — Ambulatory Visit (INDEPENDENT_AMBULATORY_CARE_PROVIDER_SITE_OTHER): Payer: BC Managed Care – PPO | Admitting: Obstetrics & Gynecology

## 2022-02-13 VITALS — BP 102/67 | HR 78 | Wt 143.0 lb

## 2022-02-13 DIAGNOSIS — Z3009 Encounter for other general counseling and advice on contraception: Secondary | ICD-10-CM

## 2022-02-13 NOTE — Progress Notes (Signed)
   GYN VISIT Patient name: Pam Clark MRN [id number]  Date of birth: [birth year] Chief Complaint:   unsure about birth control, has not had a period for Novemb  History of Present Illness:   Pam Clark is a 26 y.o. G44P0010 female being seen today for:  Family planning:  Initially was considering Nexplanon, but they have decided they desire a pregnancy.     Menses typically regular each month- last period in September.  She does note some irregularity in her menses when she is stressed.  She otherwise reports not acute complaints.  Presents today with partner to discuss.  Patient's last menstrual period was 12/26/2021.     07/15/2021    1:34 PM  Depression screen PHQ 2/9  Decreased Interest 0  Down, Depressed, Hopeless 0  PHQ - 2 Score 0  Altered sleeping 0  Tired, decreased energy 0  Change in appetite 0  Feeling bad or failure about yourself  0  Trouble concentrating 0  Moving slowly or fidgety/restless 0  Suicidal thoughts 0  PHQ-9 Score 0     Review of Systems:   Pertinent items are noted in HPI Denies fever/chills, dizziness, headaches, visual disturbances, fatigue, shortness of breath, chest pain, abdominal pain, vomiting, no problems with periods, bowel movements, urination, or intercourse unless otherwise stated above.  Pertinent History Reviewed:  Reviewed past medical,surgical, social, obstetrical and family history.  Reviewed problem list, medications and allergies. Physical Assessment:   Vitals:   02/13/22 1113  BP: 102/67  Pulse: 78  Weight: 143 lb (64.9 kg)  Body mass index is 23.08 kg/m.       Physical Examination:   General appearance: alert, well appearing, and in no distress  Psych: mood appropriate, normal affect  Skin: warm & dry   Cardiovascular: normal heart rate noted  Respiratory: normal respiratory effort, no distress  Pelvic: examination not indicated  Extremities: no edema   Chaperone: N/A    Assessment & Plan:   1) Family planning -encouraged PNV daily -discussed "healthy you" - advised against tobacco use, recreational drugs and alcohol -encouraged pt to track menses, which she is already doing -reviewed ovulation window and discussed regular IC -should she go more than 3-4 mos without a menses, pt to call []  will send in provera x 10 days if needed -all questions/concerns were addressed   Return for March annual.   07-31-1985, DO Attending Obstetrician & Gynecologist, Faculty [facility] for [facility], [facility]

## 2022-02-16 ENCOUNTER — Ambulatory Visit: Admit: 2022-02-16 | Discharge status: Absent without leave | Payer: BC Managed Care – PPO

## 2022-02-23 ENCOUNTER — Telehealth: Payer: BC Managed Care – PPO

## 2022-02-23 ENCOUNTER — Telehealth: Payer: BC Managed Care – PPO | Admitting: Nurse Practitioner

## 2022-02-23 ENCOUNTER — Encounter: Payer: Self-pay | Admitting: Nurse Practitioner

## 2022-02-23 DIAGNOSIS — J40 Bronchitis, not specified as acute or chronic: Secondary | ICD-10-CM | POA: Diagnosis not present

## 2022-02-23 MED ORDER — BENZONATATE 100 MG PO CAPS: 100.0000 mg | ORAL_CAPSULE | Freq: Three times a day (TID) | ORAL | 0 refills | Status: DC | PRN

## 2022-02-23 MED ORDER — AZITHROMYCIN 250 MG PO TABS: ORAL_TABLET | ORAL | 0 refills | Status: AC

## 2022-02-23 MED ORDER — ALBUTEROL SULFATE HFA 108 (90 BASE) MCG/ACT IN AERS: 2.0000 | INHALATION_SPRAY | Freq: Four times a day (QID) | RESPIRATORY_TRACT | 0 refills | Status: DC | PRN

## 2022-02-23 NOTE — Progress Notes (Signed)
Virtual Visit Consent   Pam Clark, you are scheduled for a virtual visit with a [facility] provider today. Just as with appointments in the office, your consent must be obtained to participate. Your consent will be active for this visit and any virtual visit you may have with one of our providers in the next 365 days. If you have a MyChart account, a copy of this consent can be sent to you electronically.  As this is a virtual visit, video technology does not allow for your provider to perform a traditional examination. This may limit your provider's ability to fully assess your condition. If your provider identifies any concerns that need to be evaluated in person or the need to arrange testing (such as labs, EKG, etc.), we will make arrangements to do so. Although advances in technology are sophisticated, we cannot ensure that it will always work on either your end or our end. If the connection with a video visit is poor, the visit may have to be switched to a telephone visit. With either a video or telephone visit, we are not always able to ensure that we have a secure connection.  By engaging in this virtual visit, you consent to the provision of healthcare and authorize for your insurance to be billed (if applicable) for the services provided during this visit. Depending on your insurance coverage, you may receive a charge related to this service.  I need to obtain your verbal consent now. Are you willing to proceed with your visit today? Kyelle Urbas has provided verbal consent on 02/23/2022 for a virtual visit (video or telephone). Viviano Simas, FNP  Date: 02/23/2022 4:36 PM  Virtual Visit via Video Note   I, Viviano Simas, connected with  Trenese Haft  ([id number], 09/18/1995) on 02/23/22 at  4:30 PM EST by a video-enabled telemedicine application and verified that I am speaking with the correct person using two identifiers.  Location: Patient: Virtual Visit  Location Patient: Home Provider: Virtual Visit Location Provider: Home Office   I discussed the limitations of evaluation and management by telemedicine and the availability of in person appointments. The patient expressed understanding and agreed to proceed.    History of Present Illness: Pam Clark is a 26 y.o. who identifies as a female who was assigned female at birth, and is being seen today for a cough. She has been sick for the past week  She had a lobectomy 2 years ago and this causes her to have more complicated URIs  She has had a dry cough with a wheeze She has been using Dayquil and Nyquil with initial relief that is now not helping   She hasn't used Albuterol since being sick   She has not had a fever    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   Bronchiectasis without complication (HCC) 10/09/2019    Allergies:  Allergies  Allergen Reactions   Doxycycline Nausea And Vomiting   Medications:  Current Outpatient Medications:    albuterol (VENTOLIN HFA) 108 (90 Base) MCG/ACT inhaler, Inhale 2 puffs into the lungs every 6 (six) hours as needed for wheezing or shortness of breath., Disp: 8 g, Rfl: 2   cetirizine (ZYRTEC) 10 MG tablet, Take 1 tablet (10 mg total) by mouth daily. (Patient not taking: Reported on 02/13/2022), Disp:  30 tablet, Rfl: 11   CRANBERRY EXTRACT PO, Take by mouth. (Patient not taking: Reported on 02/13/2022), Disp: , Rfl:    FENUGREEK PO, Take by mouth. (Patient not taking: Reported on 02/13/2022), Disp: , Rfl:    Ferrous Sulfate (IRON PO), Take 1 tablet by mouth daily. (Patient not taking: Reported on 02/13/2022), Disp: , Rfl:    fluticasone (FLONASE) 50 MCG/ACT nasal spray, Place 2 sprays into both nostrils daily., Disp: 16 g, Rfl: 6   ibuprofen (ADVIL) 600 MG tablet,  Take 1 tablet (600 mg total) by mouth every 8 (eight) hours as needed. (Patient not taking: Reported on 02/13/2022), Disp: 30 tablet, Rfl: 0   valACYclovir (VALTREX) 1000 MG tablet, Take 2 tabs now then 2 tabs in 12 hrs and stop. (Patient not taking: Reported on 02/13/2022), Disp: 4 tablet, Rfl: 0  Observations/Objective: Patient is well-developed, well-nourished in no acute distress.  Resting comfortably  at home.  Head is normocephalic, atraumatic.  No labored breathing.  Speech is clear and coherent with logical content.  Patient is alert and oriented at baseline.    Assessment and Plan: 1. Bronchitis  - azithromycin (ZITHROMAX) 250 MG tablet; Take 2 tablets on day 1, then 1 tablet daily on days 2 through 5  Dispense: 6 tablet; Refill: 0 - albuterol (VENTOLIN HFA) 108 (90 Base) MCG/ACT inhaler; Inhale 2 puffs into the lungs every 6 (six) hours as needed for wheezing or shortness of breath.  Dispense: 8 g; Refill: 0 - benzonatate (TESSALON) 100 MG capsule; Take 1 capsule (100 mg total) by mouth 3 (three) times daily as needed.  Dispense: 30 capsule; Refill: 0     Follow Up Instructions: I discussed the assessment and treatment plan with the patient. The patient was provided an opportunity to ask questions and all were answered. The patient agreed with the plan and demonstrated an understanding of the instructions.  A copy of instructions were sent to the patient via MyChart unless otherwise noted below.    The patient was advised to call back or seek an in-person evaluation if the symptoms worsen or if the condition fails to improve as anticipated.  Time:  I spent 10 minutes with the patient via telehealth technology discussing the above problems/concerns.    Viviano Simas, FNP

## 2022-03-09 ENCOUNTER — Telehealth: Payer: BC Managed Care – PPO | Admitting: Physician Assistant

## 2022-03-09 DIAGNOSIS — Z719 Counseling, unspecified: Secondary | ICD-10-CM | POA: Diagnosis not present

## 2022-03-09 NOTE — Progress Notes (Signed)
Virtual Visit Consent   Pam Clark, you are scheduled for a virtual visit with a [facility] provider today. Just as with appointments in the office, your consent must be obtained to participate. Your consent will be active for this visit and any virtual visit you may have with one of our providers in the next 365 days. If you have a MyChart account, a copy of this consent can be sent to you electronically.  As this is a virtual visit, video technology does not allow for your provider to perform a traditional examination. This may limit your provider's ability to fully assess your condition. If your provider identifies any concerns that need to be evaluated in person or the need to arrange testing (such as labs, EKG, etc.), we will make arrangements to do so. Although advances in technology are sophisticated, we cannot ensure that it will always work on either your end or our end. If the connection with a video visit is poor, the visit may have to be switched to a telephone visit. With either a video or telephone visit, we are not always able to ensure that we have a secure connection.  By engaging in this virtual visit, you consent to the provision of healthcare and authorize for your insurance to be billed (if applicable) for the services provided during this visit. Depending on your insurance coverage, you may receive a charge related to this service.  I need to obtain your verbal consent now. Are you willing to proceed with your visit today? Pam Clark has provided verbal consent on 03/09/2022 for a virtual visit (video or telephone). Pam Loveless, PA-C  Date: 03/09/2022 6:58 PM  Virtual Visit via Video Note   I, Pam Clark, connected with  Pam Clark  ([id number], 1995-11-09) on 03/09/22 at  6:45 PM EST by a video-enabled telemedicine application and verified that I am speaking with the correct person using two identifiers.  Location: Patient:  Virtual Visit Location Patient: Other: work Provider: Pharmacist, community: Home Office   I discussed the limitations of evaluation and management by telemedicine and the availability of in person appointments. The patient expressed understanding and agreed to proceed.    History of Present Illness: Pam Clark is a 26 y.o. who identifies as a female who was assigned female at birth, and is being seen today for questions about possible sexual issue. Reports that she and her partner have been having increased intercourse of recent as they are trying to conceive. She report he has been taking enhancement drugs and having a lot of stimulation. Today, he called her and told her that he is having penile discharge that has not subsided. The discharge is described as clear. He denies any pain or urinary changes. She denies any symptoms at all. She does not feel he has been unfaithful. She is seeking further advice.   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   Bronchiectasis without complication (HCC) 10/09/2019    Allergies:  Allergies  Allergen Reactions   Doxycycline Nausea And Vomiting   Medications:  Current Outpatient Medications:    albuterol (VENTOLIN HFA) 108 (90 Base) MCG/ACT inhaler, Inhale 2 puffs into the lungs every 6 (six) hours as needed for wheezing or shortness of breath., Disp: 8 g, Rfl: 2   albuterol (  VENTOLIN HFA) 108 (90 Base) MCG/ACT inhaler, Inhale 2 puffs into the lungs every 6 (six) hours as needed for wheezing or shortness of breath., Disp: 8 g, Rfl: 0   benzonatate (TESSALON) 100 MG capsule, Take 1 capsule (100 mg total) by mouth 3 (three) times daily as needed., Disp: 30 capsule, Rfl: 0   cetirizine (ZYRTEC) 10 MG tablet, Take 1 tablet (10 mg total) by mouth daily. (Patient not  taking: Reported on 02/13/2022), Disp: 30 tablet, Rfl: 11   CRANBERRY EXTRACT PO, Take by mouth. (Patient not taking: Reported on 02/13/2022), Disp: , Rfl:    FENUGREEK PO, Take by mouth. (Patient not taking: Reported on 02/13/2022), Disp: , Rfl:    Ferrous Sulfate (IRON PO), Take 1 tablet by mouth daily. (Patient not taking: Reported on 02/13/2022), Disp: , Rfl:    fluticasone (FLONASE) 50 MCG/ACT nasal spray, Place 2 sprays into both nostrils daily., Disp: 16 g, Rfl: 6   ibuprofen (ADVIL) 600 MG tablet, Take 1 tablet (600 mg total) by mouth every 8 (eight) hours as needed. (Patient not taking: Reported on 02/13/2022), Disp: 30 tablet, Rfl: 0   valACYclovir (VALTREX) 1000 MG tablet, Take 2 tabs now then 2 tabs in 12 hrs and stop. (Patient not taking: Reported on 02/13/2022), Disp: 4 tablet, Rfl: 0  Observations/Objective: Patient is well-developed, well-nourished in no acute distress.  Resting comfortably Head is normocephalic, atraumatic.  No labored breathing.  Speech is clear and coherent with logical content.  Patient is alert and oriented at baseline.   Assessment and Plan: 1. Health counseling  - Advised that if he is having no warning signs and she truly is not concerned for unfaithfulness that it would be recommended to abstain from sexual activity at this time. Have him stop the enhancement drugs, push fluids, and see if discharge subsides. If not he should be evaluated in person for the penile discharge. She should continue to monitor for symptoms and if any changes she also should seek in person evaluation for testing. She voiced understanding and agrees.  Follow Up Instructions: I discussed the assessment and treatment plan with the patient. The patient was provided an opportunity to ask questions and all were answered. The patient agreed with the plan and demonstrated an understanding of the instructions.  A copy of instructions were sent to the patient via MyChart unless  otherwise noted below.    The patient was advised to call back or seek an in-person evaluation if the symptoms worsen or if the condition fails to improve as anticipated.  Time:  I spent 8 minutes with the patient via telehealth technology discussing the above problems/concerns.    Pam Loveless, PA-C

## 2022-03-09 NOTE — Patient Instructions (Signed)
Pam Clark, thank you for joining [name], PA-C for today's virtual visit.  While this provider is not your primary care provider (PCP), if your PCP is located in our provider database this encounter information will be shared with them immediately following your visit.   A [facility] MyChart account gives you access to today's visit and all your visits, tests, and labs performed at [facility] " click here if you don't have a [facility] MyChart account or go to [email or web address]  Consent: (Patient) Pam Clark provided verbal consent for this virtual visit at the beginning of the encounter.  Current Medications:  Current Outpatient Medications:    albuterol (VENTOLIN HFA) 108 (90 Base) MCG/ACT inhaler, Inhale 2 puffs into the lungs every 6 (six) hours as needed for wheezing or shortness of breath., Disp: 8 g, Rfl: 2   albuterol (VENTOLIN HFA) 108 (90 Base) MCG/ACT inhaler, Inhale 2 puffs into the lungs every 6 (six) hours as needed for wheezing or shortness of breath., Disp: 8 g, Rfl: 0   benzonatate (TESSALON) 100 MG capsule, Take 1 capsule (100 mg total) by mouth 3 (three) times daily as needed., Disp: 30 capsule, Rfl: 0   cetirizine (ZYRTEC) 10 MG tablet, Take 1 tablet (10 mg total) by mouth daily. (Patient not taking: Reported on 02/13/2022), Disp: 30 tablet, Rfl: 11   CRANBERRY EXTRACT PO, Take by mouth. (Patient not taking: Reported on 02/13/2022), Disp: , Rfl:    FENUGREEK PO, Take by mouth. (Patient not taking: Reported on 02/13/2022), Disp: , Rfl:    Ferrous Sulfate (IRON PO), Take 1 tablet by mouth daily. (Patient not taking: Reported on 02/13/2022), Disp: , Rfl:    fluticasone (FLONASE) 50 MCG/ACT nasal spray, Place 2 sprays into both nostrils daily., Disp: 16 g, Rfl: 6   ibuprofen (ADVIL) 600 MG tablet, Take 1 tablet (600 mg total) by mouth every 8 (eight) hours as needed. (Patient not taking: Reported on 02/13/2022), Disp: 30  tablet, Rfl: 0   valACYclovir (VALTREX) 1000 MG tablet, Take 2 tabs now then 2 tabs in 12 hrs and stop. (Patient not taking: Reported on 02/13/2022), Disp: 4 tablet, Rfl: 0   Medications ordered in this encounter:  No orders of the defined types were placed in this encounter.    *If you need refills on other medications prior to your next appointment, please contact your pharmacy*  Follow-Up: Call back or seek an in-person evaluation if the symptoms worsen or if the condition fails to improve as anticipated.  [facility] [phone]  Other Instructions Safe Sex Practicing safe sex means taking steps before and during sex to reduce your risk of: Getting an STI (sexually transmitted infection). Giving your partner an STI. Unwanted or unplanned pregnancy. How to practice safe sex Ways you can practice safe sex  Limit your sexual partners to only one partner who is having sex with only you. Avoid using alcohol and drugs before having sex. Alcohol and drugs can affect your judgment. Before having sex with a new partner: Talk to your partner about past partners, past STIs, and drug use. Get screened for STIs and discuss the results with your partner. Ask your partner to get screened too. Check your body regularly for sores, blisters, rashes, or unusual discharge. If you notice any of these problems, visit your health care provider. Avoid sexual contact if you have symptoms of an infection or you are being treated for an STI. While having sex, use a  condom. Make sure to: Use a condom every time you have vaginal, oral, or anal sex. Both females and males should wear condoms during oral sex. Keep condoms in place from the beginning to the end of sexual activity. Use a latex condom, if possible. Latex condoms offer the best protection. Use only water-based lubricants with a condom. Using petroleum-based lubricants or oils will weaken the condom and increase the chance that it  will break. Ways your health care provider can help you practice safe sex  See your health care provider for regular screenings, exams, and tests for STIs. Talk with your health care provider about what kind of birth control (contraception) is best for you. Get vaccinated against hepatitis B and human papillomavirus (HPV). If you are at risk of being infected with HIV (human immunodeficiency virus), talk with your health care provider about taking a prescription medicine to prevent HIV infection. You are at risk for HIV if you: Are a man who has sex with other men. Are sexually active with more than one partner. Take drugs by injection. Have a sex partner who has HIV. Have unprotected sex. Have sex with someone who has sex with both men and women. Have had an STI. Follow these instructions at home: Take over-the-counter and prescription medicines only as told by your health care provider. Keep all follow-up visits. This is important. Where to find more information Centers for Disease Control and Prevention: FootballExhibition.com.br Planned Parenthood: www.plannedparenthood.org Office on [employer] Health: http://hoffman.com/ Summary Practicing safe sex means taking steps before and during sex to reduce your risk getting an STI, giving your partner an STI, and having an unwanted or unplanned pregnancy. Before having sex with a new partner, talk to your partner about past partners, past STIs, and drug use. Use a condom every time you have vaginal, oral, or anal sex. Both females and males should wear condoms during oral sex. Check your body regularly for sores, blisters, rashes, or unusual discharge. If you notice any of these problems, visit your health care provider. See your health care provider for regular screenings, exams, and tests for STIs. This information is not intended to replace advice given to you by your health care provider. Make sure you discuss any questions you have with your health care  provider. Document Revised: 08/21/2019 Document Reviewed: 08/21/2019 Elsevier Patient Education  2023 Elsevier Inc.    If you have been instructed to have an in-person evaluation today at a local Urgent Care facility, please use the link below. It will take you to a list of all of our available [facility], including address, phone number and hours of operation. Please do not delay care.  [facility]  If you or a family member do not have a primary care provider, use the link below to schedule a visit and establish care. When you choose a [facility] primary care physician or advanced practice provider, you gain a long-term partner in health. Find a Primary Care Provider  Learn more about [facility]'s in-office and virtual care options: [facility] - Get Care Now

## 2022-03-10 ENCOUNTER — Encounter: Payer: Self-pay | Admitting: Obstetrics & Gynecology

## 2022-04-09 ENCOUNTER — Telehealth: Payer: BC Managed Care – PPO | Admitting: Family Medicine

## 2022-04-09 DIAGNOSIS — R519 Headache, unspecified: Secondary | ICD-10-CM

## 2022-04-09 DIAGNOSIS — M542 Cervicalgia: Secondary | ICD-10-CM

## 2022-04-09 NOTE — Patient Instructions (Signed)
  Pam Clark, thank you for joining Perlie Mayo, NP for today's virtual visit.  While this provider is not your primary care provider (PCP), if your PCP is located in our provider database this encounter information will be shared with them immediately following your visit.   [facility] account gives you access to today's visit and all your visits, tests, and labs performed at [facility] " click here if you don't have a [facility] account or go to [email or web address]  Consent: (Patient) Pam Clark provided verbal consent for this virtual visit at the beginning of the encounter.  Current Medications:  Current Outpatient Medications:    albuterol (VENTOLIN HFA) 108 (90 Base) MCG/ACT inhaler, Inhale 2 puffs into the lungs every 6 (six) hours as needed for wheezing or shortness of breath., Disp: 8 g, Rfl: 2   albuterol (VENTOLIN HFA) 108 (90 Base) MCG/ACT inhaler, Inhale 2 puffs into the lungs every 6 (six) hours as needed for wheezing or shortness of breath., Disp: 8 g, Rfl: 0   benzonatate (TESSALON) 100 MG capsule, Take 1 capsule (100 mg total) by mouth 3 (three) times daily as needed., Disp: 30 capsule, Rfl: 0   cetirizine (ZYRTEC) 10 MG tablet, Take 1 tablet (10 mg total) by mouth daily. (Patient not taking: Reported on 02/13/2022), Disp: 30 tablet, Rfl: 11   CRANBERRY EXTRACT PO, Take by mouth. (Patient not taking: Reported on 02/13/2022), Disp: , Rfl:    FENUGREEK PO, Take by mouth. (Patient not taking: Reported on 02/13/2022), Disp: , Rfl:    Ferrous Sulfate (IRON PO), Take 1 tablet by mouth daily. (Patient not taking: Reported on 02/13/2022), Disp: , Rfl:    fluticasone (FLONASE) 50 MCG/ACT nasal spray, Place 2 sprays into both nostrils daily., Disp: 16 g, Rfl: 6   ibuprofen (ADVIL) 600 MG tablet, Take 1 tablet (600 mg total) by mouth every 8 (eight) hours as needed. (Patient not taking: Reported on 02/13/2022), Disp: 30 tablet,  Rfl: 0   valACYclovir (VALTREX) 1000 MG tablet, Take 2 tabs now then 2 tabs in 12 hrs and stop. (Patient not taking: Reported on 02/13/2022), Disp: 4 tablet, Rfl: 0   Medications ordered in this encounter:  No orders of the defined types were placed in this encounter.    *If you need refills on other medications prior to your next appointment, please contact your pharmacy*  Follow-Up: Call back or seek an in-person evaluation if the symptoms worsen or if the condition fails to improve as anticipated.  [facility] [phone]  Other Instructions    If you have been instructed to have an in-person evaluation today at a local Urgent Care facility, please use the link below. It will take you to a list of all of our available [facility], including address, phone number and hours of operation. Please do not delay care.  [facility]  If you or a family member do not have a primary care provider, use the link below to schedule a visit and establish care. When you choose a [facility] primary care physician or advanced practice provider, you gain a long-term partner in health. Find a Primary Care Provider  Learn more about [facility]'s in-office and virtual care options: [facility]

## 2022-04-09 NOTE — Progress Notes (Signed)
Virtual Visit Consent   Pam Clark, you are scheduled for a virtual visit with a [facility] provider today. Just as with appointments in the office, your consent must be obtained to participate. Your consent will be active for this visit and any virtual visit you may have with one of our providers in the next 365 days. If you have a MyChart account, a copy of this consent can be sent to you electronically.  As this is a virtual visit, video technology does not allow for your provider to perform a traditional examination. This may limit your provider's ability to fully assess your condition. If your provider identifies any concerns that need to be evaluated in person or the need to arrange testing (such as labs, EKG, etc.), we will make arrangements to do so. Although advances in technology are sophisticated, we cannot ensure that it will always work on either your end or our end. If the connection with a video visit is poor, the visit may have to be switched to a telephone visit. With either a video or telephone visit, we are not always able to ensure that we have a secure connection.  By engaging in this virtual visit, you consent to the provision of healthcare and authorize for your insurance to be billed (if applicable) for the services provided during this visit. Depending on your insurance coverage, you may receive a charge related to this service.  I need to obtain your verbal consent now. Are you willing to proceed with your visit today? Pam Clark has provided verbal consent on 04/09/2022 for a virtual visit (video or telephone). Perlie Mayo, NP  Date: 04/09/2022 12:43 PM  Virtual Visit via Video Note   I, Perlie Mayo, connected with  Pam Clark  ([id number], [birth year]) on 04/09/22 at 12:45 PM EST by a video-enabled telemedicine application and verified that I am speaking with the correct person using two identifiers.  Location: Patient: Virtual Visit  Location Patient: Home Provider: Virtual Visit Location Provider: Home Office   I discussed the limitations of evaluation and management by telemedicine and the availability of in person appointments. The patient expressed understanding and agreed to proceed.    History of Present Illness: Pam Clark is a 27 y.o. who identifies as a female who was assigned female at birth, and is being seen today for  Onset was two days ago Left side of neck pain- Limited ability to rotate Can lay on the left or right- having to rotate body to turn head. Ibuprofen the last two days with no help. Additionally- has a headache that started prior to the neck pain. Headache is not like others.   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   Bronchiectasis without complication ([place]) 38/18/2993    Allergies:  Allergies  Allergen Reactions   Doxycycline Nausea And Vomiting   Medications:  Current Outpatient Medications:    albuterol (VENTOLIN HFA) 108 (90 Base) MCG/ACT inhaler, Inhale 2 puffs into the lungs every 6 (six) hours as needed for wheezing or shortness of breath., Disp: 8 g, Rfl: 2   albuterol (VENTOLIN HFA) 108 (90 Base) MCG/ACT inhaler, Inhale 2 puffs into the lungs every 6 (six) hours as needed for wheezing or shortness of breath., Disp: 8 g, Rfl: 0   benzonatate (TESSALON) 100 MG  capsule, Take 1 capsule (100 mg total) by mouth 3 (three) times daily as needed., Disp: 30 capsule, Rfl: 0   cetirizine (ZYRTEC) 10 MG tablet, Take 1 tablet (10 mg total) by mouth daily. (Patient not taking: Reported on 02/13/2022), Disp: 30 tablet, Rfl: 11   CRANBERRY EXTRACT PO, Take by mouth. (Patient not taking: Reported on 02/13/2022), Disp: , Rfl:    FENUGREEK PO, Take by mouth. (Patient not taking: Reported on 02/13/2022), Disp: ,  Rfl:    Ferrous Sulfate (IRON PO), Take 1 tablet by mouth daily. (Patient not taking: Reported on 02/13/2022), Disp: , Rfl:    fluticasone (FLONASE) 50 MCG/ACT nasal spray, Place 2 sprays into both nostrils daily., Disp: 16 g, Rfl: 6   ibuprofen (ADVIL) 600 MG tablet, Take 1 tablet (600 mg total) by mouth every 8 (eight) hours as needed. (Patient not taking: Reported on 02/13/2022), Disp: 30 tablet, Rfl: 0   valACYclovir (VALTREX) 1000 MG tablet, Take 2 tabs now then 2 tabs in 12 hrs and stop. (Patient not taking: Reported on 02/13/2022), Disp: 4 tablet, Rfl: 0  Observations/Objective: Patient is well-developed, well-nourished in no acute distress.  Resting comfortably  at home.  Head is normocephalic, atraumatic.  No labored breathing.  Speech is clear and coherent with logical content.  Patient is alert and oriented at baseline.    Assessment and Plan:  1. Acute nonintractable headache, unspecified headache type  Given head and neck pain that started close together- needs in person for work up of infection or other causes of pain. Visit stopped and UC is encouraged to help get assessment and treatment  Patient acknowledged agreement and understanding of the plan.     Follow Up Instructions: I discussed the assessment and treatment plan with the patient. The patient was provided an opportunity to ask questions and all were answered. The patient agreed with the plan and demonstrated an understanding of the instructions.  A copy of instructions were sent to the patient via MyChart unless otherwise noted below.     The patient was advised to call back or seek an in-person evaluation if the symptoms worsen or if the condition fails to improve as anticipated.  Time:  I spent 10 minutes with the patient via telehealth technology discussing the above problems/concerns.    Perlie Mayo, NP

## 2022-05-04 ENCOUNTER — Telehealth: Payer: Self-pay | Admitting: Physician Assistant

## 2022-05-04 DIAGNOSIS — B9689 Other specified bacterial agents as the cause of diseases classified elsewhere: Secondary | ICD-10-CM

## 2022-05-04 DIAGNOSIS — J4531 Mild persistent asthma with (acute) exacerbation: Secondary | ICD-10-CM

## 2022-05-04 DIAGNOSIS — J208 Acute bronchitis due to other specified organisms: Secondary | ICD-10-CM

## 2022-05-04 MED ORDER — FLUTICASONE PROPIONATE HFA 110 MCG/ACT IN AERO: 1.0000 | INHALATION_SPRAY | Freq: Two times a day (BID) | RESPIRATORY_TRACT | 0 refills | Status: DC

## 2022-05-04 MED ORDER — AZITHROMYCIN 250 MG PO TABS: ORAL_TABLET | ORAL | 0 refills | Status: AC

## 2022-05-04 MED ORDER — ALBUTEROL SULFATE HFA 108 (90 BASE) MCG/ACT IN AERS: 1.0000 | INHALATION_SPRAY | Freq: Four times a day (QID) | RESPIRATORY_TRACT | 0 refills | Status: DC | PRN

## 2022-05-04 NOTE — Progress Notes (Signed)
Virtual Visit Consent   Pam Clark, you are scheduled for a virtual visit with a [facility] provider today. Just as with appointments in the office, your consent must be obtained to participate. Your consent will be active for this visit and any virtual visit you may have with one of our providers in the next 365 days. If you have a MyChart account, a copy of this consent can be sent to you electronically.  As this is a virtual visit, video technology does not allow for your provider to perform a traditional examination. This may limit your provider's ability to fully assess your condition. If your provider identifies any concerns that need to be evaluated in person or the need to arrange testing (such as labs, EKG, etc.), we will make arrangements to do so. Although advances in technology are sophisticated, we cannot ensure that it will always work on either your end or our end. If the connection with a video visit is poor, the visit may have to be switched to a telephone visit. With either a video or telephone visit, we are not always able to ensure that we have a secure connection.  By engaging in this virtual visit, you consent to the provision of healthcare and authorize for your insurance to be billed (if applicable) for the services provided during this visit. Depending on your insurance coverage, you may receive a charge related to this service.  I need to obtain your verbal consent now. Are you willing to proceed with your visit today? Pam Clark has provided verbal consent on 05/04/2022 for a virtual visit (video or telephone). Mar Daring, PA-C  Date: 05/04/2022 8:41 AM  Virtual Visit via Video Note   I, Mar Daring, connected with  Pam Clark  ([id number], 1995-04-05) on 05/04/22 at  8:30 AM EST by a video-enabled telemedicine application and verified that I am speaking with the correct person using two identifiers.  Location: Patient:  Virtual Visit Location Patient: Home Provider: Virtual Visit Location Provider: Home Office   I discussed the limitations of evaluation and management by telemedicine and the availability of in person appointments. The patient expressed understanding and agreed to proceed.    History of Present Illness: Pam Clark is a 27 y.o. who identifies as a female who was assigned female at birth, and is being seen today for cough and congestion.  HPI: Cough This is a new problem. The current episode started in the past 7 days. The problem has been gradually worsening. The problem occurs every few minutes. The cough is Productive of sputum and productive of purulent sputum. Associated symptoms include chest pain (tightness), ear congestion, a fever (low grade), headaches, myalgias, nasal congestion, postnasal drip, rhinorrhea, a sore throat (mild), sweats and wheezing. Pertinent negatives include no chills, ear pain or shortness of breath. Associated symptoms comments: Felt lightheaded and felt hot yesterday, low grade fever. The symptoms are aggravated by lying down. She has tried a beta-agonist inhaler (netti-pot, cough drops) for the symptoms. The treatment provided no relief. Her past medical history is significant for asthma and bronchitis. There is no history of pneumonia.     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   Bronchiectasis without complication ([place]) 44/05/4740    Allergies:  Allergies  Allergen Reactions   Doxycycline Nausea  And Vomiting   Medications:  Current Outpatient Medications:    albuterol (VENTOLIN HFA) 108 (90 Base) MCG/ACT inhaler, Inhale 1-2 puffs into the lungs every 6 (six) hours as needed., Disp: 18 g, Rfl: 0   azithromycin (ZITHROMAX) 250 MG tablet, Take 2 tablets on day 1, then 1 tablet  daily on days 2 through 5, Disp: 6 tablet, Rfl: 0   fluticasone (FLOVENT HFA) 110 MCG/ACT inhaler, Inhale 1 puff into the lungs in the morning and at bedtime., Disp: 12 g, Rfl: 0   benzonatate (TESSALON) 100 MG capsule, Take 1 capsule (100 mg total) by mouth 3 (three) times daily as needed., Disp: 30 capsule, Rfl: 0   cetirizine (ZYRTEC) 10 MG tablet, Take 1 tablet (10 mg total) by mouth daily. (Patient not taking: Reported on 02/13/2022), Disp: 30 tablet, Rfl: 11   CRANBERRY EXTRACT PO, Take by mouth. (Patient not taking: Reported on 02/13/2022), Disp: , Rfl:    FENUGREEK PO, Take by mouth. (Patient not taking: Reported on 02/13/2022), Disp: , Rfl:    Ferrous Sulfate (IRON PO), Take 1 tablet by mouth daily. (Patient not taking: Reported on 02/13/2022), Disp: , Rfl:    fluticasone (FLONASE) 50 MCG/ACT nasal spray, Place 2 sprays into both nostrils daily., Disp: 16 g, Rfl: 6   ibuprofen (ADVIL) 600 MG tablet, Take 1 tablet (600 mg total) by mouth every 8 (eight) hours as needed. (Patient not taking: Reported on 02/13/2022), Disp: 30 tablet, Rfl: 0   valACYclovir (VALTREX) 1000 MG tablet, Take 2 tabs now then 2 tabs in 12 hrs and stop. (Patient not taking: Reported on 02/13/2022), Disp: 4 tablet, Rfl: 0  Observations/Objective: Patient is well-developed, well-nourished in no acute distress.  Resting comfortably at home.  Head is normocephalic, atraumatic.  No labored breathing.  Speech is clear and coherent with logical content.  Patient is alert and oriented at baseline.    Assessment and Plan: 1. Acute bacterial bronchitis - azithromycin (ZITHROMAX) 250 MG tablet; Take 2 tablets on day 1, then 1 tablet daily on days 2 through 5  Dispense: 6 tablet; Refill: 0 - albuterol (VENTOLIN HFA) 108 (90 Base) MCG/ACT inhaler; Inhale 1-2 puffs into the lungs every 6 (six) hours as needed.  Dispense: 18 g; Refill: 0 - fluticasone (FLOVENT HFA) 110 MCG/ACT inhaler; Inhale 1 puff into the lungs in the  morning and at bedtime.  Dispense: 12 g; Refill: 0  2. Mild persistent asthma with exacerbation - albuterol (VENTOLIN HFA) 108 (90 Base) MCG/ACT inhaler; Inhale 1-2 puffs into the lungs every 6 (six) hours as needed.  Dispense: 18 g; Refill: 0 - fluticasone (FLOVENT HFA) 110 MCG/ACT inhaler; Inhale 1 puff into the lungs in the morning and at bedtime.  Dispense: 12 g; Refill: 0  - Worsening over a week despite OTC medications - Will treat with Z-pack  - Flovent added for asthma - Albuterol refilled - Can continue Mucinex  - Push fluids.  - Rest.  - Steam and humidifier can help - Seek in person evaluation if worsening or symptoms fail to improve    Follow Up Instructions: I discussed the assessment and treatment plan with the patient. The patient was provided an opportunity to ask questions and all were answered. The patient agreed with the plan and demonstrated an understanding of the instructions.  A copy of instructions were sent to the patient via MyChart unless otherwise noted below.    The patient was advised to call back or seek an in-person evaluation if the  symptoms worsen or if the condition fails to improve as anticipated.  Time:  I spent 11 minutes with the patient via telehealth technology discussing the above problems/concerns.    Mar Daring, PA-C

## 2022-05-04 NOTE — Patient Instructions (Signed)
Pam Clark, thank you for joining Mar Daring, PA-C for today's virtual visit.  While this provider is not your primary care provider (PCP), if your PCP is located in our provider database this encounter information will be shared with them immediately following your visit.   [facility] account gives you access to today's visit and all your visits, tests, and labs performed at [facility] " click here if you don't have a [facility] account or go to [email or web address]  Consent: (Patient) Pam Clark provided verbal consent for this virtual visit at the beginning of the encounter.  Current Medications:  Current Outpatient Medications:    albuterol (VENTOLIN HFA) 108 (90 Base) MCG/ACT inhaler, Inhale 1-2 puffs into the lungs every 6 (six) hours as needed., Disp: 18 g, Rfl: 0   azithromycin (ZITHROMAX) 250 MG tablet, Take 2 tablets on day 1, then 1 tablet daily on days 2 through 5, Disp: 6 tablet, Rfl: 0   fluticasone (FLOVENT HFA) 110 MCG/ACT inhaler, Inhale 1 puff into the lungs in the morning and at bedtime., Disp: 12 g, Rfl: 0   benzonatate (TESSALON) 100 MG capsule, Take 1 capsule (100 mg total) by mouth 3 (three) times daily as needed., Disp: 30 capsule, Rfl: 0   cetirizine (ZYRTEC) 10 MG tablet, Take 1 tablet (10 mg total) by mouth daily. (Patient not taking: Reported on 02/13/2022), Disp: 30 tablet, Rfl: 11   CRANBERRY EXTRACT PO, Take by mouth. (Patient not taking: Reported on 02/13/2022), Disp: , Rfl:    FENUGREEK PO, Take by mouth. (Patient not taking: Reported on 02/13/2022), Disp: , Rfl:    Ferrous Sulfate (IRON PO), Take 1 tablet by mouth daily. (Patient not taking: Reported on 02/13/2022), Disp: , Rfl:    fluticasone (FLONASE) 50 MCG/ACT nasal spray, Place 2 sprays into both nostrils daily., Disp: 16 g, Rfl: 6   ibuprofen (ADVIL) 600 MG tablet, Take 1 tablet (600 mg total) by mouth every 8 (eight) hours as needed.  (Patient not taking: Reported on 02/13/2022), Disp: 30 tablet, Rfl: 0   valACYclovir (VALTREX) 1000 MG tablet, Take 2 tabs now then 2 tabs in 12 hrs and stop. (Patient not taking: Reported on 02/13/2022), Disp: 4 tablet, Rfl: 0   Medications ordered in this encounter:  Meds ordered this encounter  Medications   azithromycin (ZITHROMAX) 250 MG tablet    Sig: Take 2 tablets on day 1, then 1 tablet daily on days 2 through 5    Dispense:  6 tablet    Refill:  0    Order Specific Question:   Supervising Provider    Answer:   Chase Picket [[id number]]   albuterol (VENTOLIN HFA) 108 (90 Base) MCG/ACT inhaler    Sig: Inhale 1-2 puffs into the lungs every 6 (six) hours as needed.    Dispense:  18 g    Refill:  0    Order Specific Question:   Supervising Provider    Answer:   LAMPTEY, PHILIP O [[id number]]   fluticasone (FLOVENT HFA) 110 MCG/ACT inhaler    Sig: Inhale 1 puff into the lungs in the morning and at bedtime.    Dispense:  12 g    Refill:  0    Order Specific Question:   Supervising Provider    Answer:   Chase Picket [id number]     *If you need refills on other medications prior to your next appointment, please contact your pharmacy*  Follow-Up: Call back or  seek an in-person evaluation if the symptoms worsen or if the condition fails to improve as anticipated.  [facility] [phone]  Other Instructions  Acute Bronchitis, Adult  Acute bronchitis is sudden inflammation of the main airways (bronchi) that come off the windpipe (trachea) in the lungs. The swelling causes the airways to get smaller and make more mucus than normal. This can make it hard to breathe and can cause coughing or noisy breathing (wheezing). Acute bronchitis may last several weeks. The cough may last longer. Allergies, asthma, and exposure to smoke may make the condition worse. What are the causes? This condition can be caused by germs and by substances that irritate the lungs,  including: Cold and flu viruses. The most common cause of this condition is the virus that causes the common cold. Bacteria. This is less common. Breathing in substances that irritate the lungs, including: Smoke from cigarettes and other forms of tobacco. Dust and pollen. Fumes from household cleaning products, gases, or burned fuel. Indoor or outdoor air pollution. What increases the risk? The following factors may make you more likely to develop this condition: A weak body's defense system, also called the immune system. A condition that affects your lungs and breathing, such as asthma. What are the signs or symptoms? Common symptoms of this condition include: Coughing. This may bring up clear, yellow, or green mucus from your lungs (sputum). Wheezing. Runny or stuffy nose. Having too much mucus in your lungs (chest congestion). Shortness of breath. Aches and pains, including sore throat or chest. How is this diagnosed? This condition is usually diagnosed based on: Your symptoms and medical history. A physical exam. You may also have other tests, including tests to rule out other conditions, such as pneumonia. These tests include: A test of lung function. Test of a mucus sample to look for the presence of bacteria. Tests to check the oxygen level in your blood. Blood tests. Chest X-ray. How is this treated? Most cases of acute bronchitis clear up over time without treatment. Your health care provider may recommend: Drinking more fluids to help thin your mucus so it is easier to cough up. Taking inhaled medicine (inhaler) to improve air flow in and out of your lungs. Using a vaporizer or a humidifier. These are machines that add water to the air to help you breathe better. Taking a medicine that thins mucus and clears congestion (expectorant). Taking a medicine that prevents or stops coughing (cough suppressant). It is not common to take an antibiotic medicine for this  condition. Follow these instructions at home:  Take over-the-counter and prescription medicines only as told by your health care provider. Use an inhaler, vaporizer, or humidifier as told by your health care provider. Take two teaspoons (10 mL) of honey at bedtime to lessen coughing at night. Drink enough fluid to keep your urine pale yellow. Do not use any products that contain nicotine or tobacco. These products include cigarettes, chewing tobacco, and vaping devices, such as e-cigarettes. If you need help quitting, ask your health care provider. Get plenty of rest. Return to your normal activities as told by your health care provider. Ask your health care provider what activities are safe for you. Keep all follow-up visits. This is important. How is this prevented? To lower your risk of getting this condition again: Wash your hands often with soap and water for at least 20 seconds. If soap and water are not available, use hand sanitizer. Avoid contact with people who  have cold symptoms. Try not to touch your mouth, nose, or eyes with your hands. Avoid breathing in smoke or chemical fumes. Breathing smoke or chemical fumes will make your condition worse. Get the flu shot every year. Contact a health care provider if: Your symptoms do not improve after 2 weeks. You have trouble coughing up the mucus. Your cough keeps you awake at night. You have a fever. Get help right away if you: Cough up blood. Feel pain in your chest. Have severe shortness of breath. Faint or keep feeling like you are going to faint. Have a severe headache. Have a fever or chills that get worse. These symptoms may represent a serious problem that is an emergency. Do not wait to see if the symptoms will go away. Get medical help right away. Call your local emergency services (911 in the [place] Do not drive yourself to the hospital. Summary Acute bronchitis is inflammation of the main airways (bronchi) that come  off the windpipe (trachea) in the lungs. The swelling causes the airways to get smaller and make more mucus than normal. Drinking more fluids can help thin your mucus so it is easier to cough up. Take over-the-counter and prescription medicines only as told by your health care provider. Do not use any products that contain nicotine or tobacco. These products include cigarettes, chewing tobacco, and vaping devices, such as e-cigarettes. If you need help quitting, ask your health care provider. Contact a health care provider if your symptoms do not improve after 2 weeks. This information is not intended to replace advice given to you by your health care provider. Make sure you discuss any questions you have with your health care provider. Document Revised: 06/26/2021 Document Reviewed: 07/17/2020 Elsevier Patient Education  [place].    If you have been instructed to have an in-person evaluation today at a local Urgent Care facility, please use the link below. It will take you to a list of all of our available [facility], including address, phone number and hours of operation. Please do not delay care.  [facility]  If you or a family member do not have a primary care provider, use the link below to schedule a visit and establish care. When you choose a [facility] primary care physician or advanced practice provider, you gain a long-term partner in health. Find a Primary Care Provider  Learn more about [facility]'s in-office and virtual care options: [facility]

## 2022-05-05 ENCOUNTER — Telehealth: Payer: Self-pay | Admitting: Physician Assistant

## 2022-05-05 ENCOUNTER — Encounter: Payer: Self-pay | Admitting: Physician Assistant

## 2022-05-05 DIAGNOSIS — Z3490 Encounter for supervision of normal pregnancy, unspecified, unspecified trimester: Secondary | ICD-10-CM

## 2022-05-05 NOTE — Progress Notes (Signed)
Virtual Visit Consent   [name], you are scheduled for a virtual visit with a [facility] provider today. Just as with appointments in the office, your consent must be obtained to participate. Your consent will be active for this visit and any virtual visit you may have with one of our providers in the next 365 days. If you have a MyChart account, a copy of this consent can be sent to you electronically.  As this is a virtual visit, video technology does not allow for your provider to perform a traditional examination. This may limit your provider's ability to fully assess your condition. If your provider identifies any concerns that need to be evaluated in person or the need to arrange testing (such as labs, EKG, etc.), we will make arrangements to do so. Although advances in technology are sophisticated, we cannot ensure that it will always work on either your end or our end. If the connection with a video visit is poor, the visit may have to be switched to a telephone visit. With either a video or telephone visit, we are not always able to ensure that we have a secure connection.  By engaging in this virtual visit, you consent to the provision of healthcare and authorize for your insurance to be billed (if applicable) for the services provided during this visit. Depending on your insurance coverage, you may receive a charge related to this service.  I need to obtain your verbal consent now. Are you willing to proceed with your visit today? Pam Clark has provided verbal consent on 05/05/2022 for a virtual visit (video or telephone). Mar Daring, PA-C  Date: 05/05/2022 2:17 PM  Virtual Visit via Video Note   I, Mar Daring, connected with  Pam Clark  ([id number], [birth year]) on 05/05/22 at  1:45 PM EST by a video-enabled telemedicine application and verified that I am speaking with the correct person using two identifiers.  Location: Patient:  Virtual Visit Location Patient: Home Provider: Virtual Visit Location Provider: Home Office   I discussed the limitations of evaluation and management by telemedicine and the availability of in person appointments. The patient expressed understanding and agreed to proceed.    History of Present Illness: Pam Clark is a 27 y.o. who identifies as a female who was assigned female at birth, and is being seen today for having nausea, vomiting, diarrhea last night. Had been feeling lightheaded. Went out last night and just did not feel well. Did not eat, drank water. Had general unwell feeling. Came home and rested. Around 1am went to let her dogs out and when she came back upstairs she started vomiting. Had vomiting and diarrhea for about 6 hours. Has been trying to drink Ginger Ale and has been feeling slightly better. Decided to take a pregnancy test to be safe, and it was positive.    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   Bronchiectasis without complication ([place]) 74/02/8785    Allergies:  Allergies  Allergen Reactions   Doxycycline Nausea And Vomiting   Medications:  Current Outpatient Medications:    albuterol (VENTOLIN HFA) 108 (90 Base) MCG/ACT inhaler, Inhale 1-2 puffs into the lungs every 6 (six) hours as needed., Disp: 18 g, Rfl: 0   azithromycin (ZITHROMAX) 250 MG tablet, Take 2 tablets on day  1, then 1 tablet daily on days 2 through 5, Disp: 6 tablet, Rfl: 0   fluticasone (FLOVENT HFA) 110 MCG/ACT inhaler, Inhale 1 puff into the lungs in the morning and at bedtime., Disp: 12 g, Rfl: 0  Observations/Objective: Patient is well-developed, well-nourished in no acute distress.  Resting comfortably at home.  Head is normocephalic, atraumatic.  No labored breathing.  Speech is clear and coherent with  logical content.  Patient is alert and oriented at baseline.    Assessment and Plan: 1. Pregnancy, unspecified gestational age  - Advised Zpack albuterol, and flovent inhaler were all okay to continue - Start prenatal vitamin - Will send information on first trimester, women's health centers, and common OTC medications that are safe - Discussed following up with PCP or with a Women's Health center for further evaluation, she voiced understanding  Follow Up Instructions: I discussed the assessment and treatment plan with the patient. The patient was provided an opportunity to ask questions and all were answered. The patient agreed with the plan and demonstrated an understanding of the instructions.  A copy of instructions were sent to the patient via MyChart unless otherwise noted below.    The patient was advised to call back or seek an in-person evaluation if the symptoms worsen or if the condition fails to improve as anticipated.  Time:  I spent 15 minutes with the patient via telehealth technology discussing the above problems/concerns.    Mar Daring, PA-C

## 2022-05-05 NOTE — Patient Instructions (Addendum)
Pam Clark, thank you for joining Mar Daring, PA-C for today's virtual visit.  While this provider is not your primary care provider (PCP), if your PCP is located in our provider database this encounter information will be shared with them immediately following your visit.   [facility] account gives you access to today's visit and all your visits, tests, and labs performed at [facility] " click here if you don't have a [facility] account or go to [email or web address]  Consent: (Patient) Pam Clark provided verbal consent for this virtual visit at the beginning of the encounter.  Current Medications:  Current Outpatient Medications:    albuterol (VENTOLIN HFA) 108 (90 Base) MCG/ACT inhaler, Inhale 1-2 puffs into the lungs every 6 (six) hours as needed., Disp: 18 g, Rfl: 0   azithromycin (ZITHROMAX) 250 MG tablet, Take 2 tablets on day 1, then 1 tablet daily on days 2 through 5, Disp: 6 tablet, Rfl: 0   fluticasone (FLOVENT HFA) 110 MCG/ACT inhaler, Inhale 1 puff into the lungs in the morning and at bedtime., Disp: 12 g, Rfl: 0   Medications ordered in this encounter:  No orders of the defined types were placed in this encounter.    *If you need refills on other medications prior to your next appointment, please contact your pharmacy*  Follow-Up: Call back or seek an in-person evaluation if the symptoms worsen or if the condition fails to improve as anticipated.  [place] [phone]  Other Instructions  Can use Dramamine (Dimenhydrinate), Benadryl (Diphenhydramine), or Doxylamine Take 12.5mg  to 25mg  for nausea Pyridoxine (B6) 10-25mg  every 6-8 hours for nausea Ginger tablets, ginger ale, ginger teas  First Trimester of Pregnancy  The first trimester of pregnancy starts on the first day of your last menstrual period until the end of week 12. This is months 1 through 3 of pregnancy. A week after a  sperm fertilizes an egg, the egg will implant into the wall of the uterus and begin to develop into a baby. By the end of 12 weeks, all the baby's organs will be formed and the baby will be 2-3 inches in size. Body changes during your first trimester Your body goes through many changes during pregnancy. The changes vary and generally return to normal after your baby is born. Physical changes You may gain or lose weight. Your breasts may begin to grow larger and become tender. The tissue that surrounds your nipples (areola) may become darker. Dark spots or blotches (chloasma or mask of pregnancy) may develop on your face. You may have changes in your hair. These can include thickening or thinning of your hair or changes in texture. Health changes You may feel nauseous, and you may vomit. You may have heartburn. You may develop headaches. You may develop constipation. Your gums may bleed and may be sensitive to brushing and flossing. Other changes You may tire easily. You may urinate more often. Your menstrual periods will stop. You may have a loss of appetite. You may develop cravings for certain kinds of food. You may have changes in your emotions from day to day. You may have more vivid and strange dreams. Follow these instructions at home: Medicines Follow your health care provider's instructions regarding medicine use. Specific medicines may be either safe or unsafe to take during pregnancy. Do not take any medicines unless told to by your health care provider. Take a prenatal vitamin that contains at least 600 micrograms (mcg)  of folic acid. Eating and drinking Eat a healthy diet that includes fresh fruits and vegetables, whole grains, good sources of protein such as meat, eggs, or tofu, and low-fat dairy products. Avoid raw meat and unpasteurized juice, milk, and cheese. These carry germs that can harm you and your baby. If you feel nauseous or you vomit: Eat 4 or 5 small meals a  day instead of 3 large meals. Try eating a few soda crackers. Drink liquids between meals instead of during meals. You may need to take these actions to prevent or treat constipation: Drink enough fluid to keep your urine pale yellow. Eat foods that are high in fiber, such as beans, whole grains, and fresh fruits and vegetables. Limit foods that are high in fat and processed sugars, such as fried or sweet foods. Activity Exercise only as directed by your health care provider. Most people can continue their usual exercise routine during pregnancy. Try to exercise for 30 minutes at least 5 days a week. Stop exercising if you develop pain or cramping in the lower abdomen or lower back. Avoid exercising if it is very hot or humid or if you are at high altitude. Avoid heavy lifting. If you choose to, you may have sex unless your health care provider tells you not to. Relieving pain and discomfort Wear a good support bra to relieve breast tenderness. Rest with your legs elevated if you have leg cramps or low back pain. If you develop bulging veins (varicose veins) in your legs: Wear support hose as told by your health care provider. Elevate your feet for 15 minutes, 3-4 times a day. Limit salt in your diet. Safety Wear your seat belt at all times when driving or riding in a car. Talk with your health care provider if someone is verbally or physically abusive to you. Talk with your health care provider if you are feeling sad or have thoughts of hurting yourself. Lifestyle Do not use hot tubs, steam rooms, or saunas. Do not douche. Do not use tampons or scented sanitary pads. Do not use herbal remedies, alcohol, illegal drugs, or medicines that are not approved by your health care provider. Chemicals in these products can harm your baby. Do not use any products that contain nicotine or tobacco, such as cigarettes, e-cigarettes, and chewing tobacco. If you need help quitting, ask your health care  provider. Avoid cat litter boxes and soil used by cats. These carry germs that can cause birth defects in the baby and possibly loss of the unborn baby (fetus) by miscarriage or stillbirth. General instructions During routine prenatal visits in the first trimester, your health care provider will do a physical exam, perform necessary tests, and ask you how things are going. Keep all follow-up visits. This is important. Ask for help if you have counseling or nutritional needs during pregnancy. Your health care provider can offer advice or refer you to specialists for help with various needs. Schedule a dentist appointment. At home, brush your teeth with a soft toothbrush. Floss gently. Write down your questions. Take them to your prenatal visits. Where to find more information American Pregnancy Association: americanpregnancy.[place] and Gynecologists: PoolDevices.com.pt Office on [employer]: KeywordPortfolios.com.br Contact a health care provider if you have: Dizziness. A fever. Mild pelvic cramps, pelvic pressure, or nagging pain in the abdominal area. Nausea, vomiting, or diarrhea that lasts for 24 hours or longer. A bad-smelling vaginal discharge. Pain when you urinate. Known exposure to a contagious illness, such  as chickenpox, measles, Zika virus, HIV, or hepatitis. Get help right away if you have: Spotting or bleeding from your vagina. Severe abdominal cramping or pain. Shortness of breath or chest pain. Any kind of trauma, such as from a fall or a car crash. New or increased pain, swelling, or redness in an arm or leg. Summary The first trimester of pregnancy starts on the first day of your last menstrual period until the end of week 12 (months 1 through 3). Eating 4 or 5 small meals a day rather than 3 large meals may help to relieve nausea and vomiting. Do not use any products that contain nicotine or tobacco, such as  cigarettes, e-cigarettes, and chewing tobacco. If you need help quitting, ask your health care provider. Keep all follow-up visits. This is important. This information is not intended to replace advice given to you by your health care provider. Make sure you discuss any questions you have with your health care provider. Document Revised: 08/23/2019 Document Reviewed: 06/29/2019 Elsevier Patient Education  [place].    If you have been instructed to have an in-person evaluation today at a local Urgent Care facility, please use the link below. It will take you to a list of all of our available [facility], including address, phone number and hours of operation. Please do not delay care.  [facility]  If you or a family member do not have a primary care provider, use the link below to schedule a visit and establish care. When you choose a [facility] primary care physician or advanced practice provider, you gain a long-term partner in health. Find a Primary Care Provider  Learn more about [place] [facility]'s in-office and virtual care options: [facility]

## 2022-05-11 ENCOUNTER — Ambulatory Visit (INDEPENDENT_AMBULATORY_CARE_PROVIDER_SITE_OTHER): Payer: 59 | Admitting: *Deleted

## 2022-05-11 VITALS — BP 102/64 | HR 76 | Ht 66.0 in | Wt 139.0 lb

## 2022-05-11 DIAGNOSIS — N926 Irregular menstruation, unspecified: Secondary | ICD-10-CM

## 2022-05-11 DIAGNOSIS — Z3201 Encounter for pregnancy test, result positive: Secondary | ICD-10-CM

## 2022-05-11 LAB — POCT URINE PREGNANCY: Preg Test, Ur: POSITIVE — AB

## 2022-05-11 NOTE — Progress Notes (Signed)
   NURSE VISIT- PREGNANCY CONFIRMATION   SUBJECTIVE:  Pam Clark is a 27 y.o. [place] female at Unknown by certain LMP of Patient's last menstrual period was 04/08/2022. Here for pregnancy confirmation.  Home pregnancy test: positive x 1   She reports no complaints.  She is not taking prenatal vitamins.    OBJECTIVE:  BP 102/64 (BP Location: Left Arm, Patient Position: Sitting, Cuff Size: Normal)   Pulse 76   Ht 5\' 6"  (1.676 m)   Wt 139 lb (63 kg)   LMP 04/08/2022   BMI 22.44 kg/m   Appears well, in no apparent distress  Results for orders placed or performed in visit on 05/11/22 (from the past 24 hour(s))  POCT urine pregnancy   Collection Time: 05/11/22 10:46 AM  Result Value Ref Range   Preg Test, Ur Positive (A) Negative    ASSESSMENT: Positive pregnancy test, Unknown by LMP    PLAN: Schedule for dating ultrasound in 4 weeks Prenatal vitamins: plans to begin OTC ASAP   Nausea medicines: not currently needed   OB packet given: Yes  Alice Rieger  05/11/2022 10:52 AM

## 2022-05-22 ENCOUNTER — Other Ambulatory Visit: Payer: 59

## 2022-05-22 ENCOUNTER — Telehealth: Payer: Self-pay | Admitting: *Deleted

## 2022-05-22 ENCOUNTER — Telehealth: Payer: 59 | Admitting: Physician Assistant

## 2022-05-22 NOTE — Telephone Encounter (Signed)
Patient called stating she fell down the stairs a few days ago.  After she fell, she started having heavy vaginal bleeding and abdominal pain and bled for 2 days.  States she is currently not bleeding but is having cramping.  Concerned she could have miscarried.  Discussed with patient that the uterus is protected by the pelvis but miscarriage is possible.  Can order HCG to get baseline. Advised no intercourse for 7 days and to push fluids.  If pain worsens, then should got to the hospital for evaluation. Pt verbalized understanding with no further questions.

## 2022-05-22 NOTE — Progress Notes (Signed)
Patient message sent instructing her to contact her OB or be evaluated in person ASAP. She voiced understanding. Per EMR review she has contacted OB and is going in for HCG levels. Encounter closed.

## 2022-05-23 LAB — BETA HCG QUANT (REF LAB): hCG Quant: 25720 m[IU]/mL

## 2022-06-05 ENCOUNTER — Other Ambulatory Visit: Payer: Self-pay | Admitting: Obstetrics & Gynecology

## 2022-06-05 DIAGNOSIS — O3680X0 Pregnancy with inconclusive fetal viability, not applicable or unspecified: Secondary | ICD-10-CM

## 2022-06-08 ENCOUNTER — Other Ambulatory Visit: Payer: Self-pay

## 2022-08-26 ENCOUNTER — Encounter: Payer: Self-pay | Admitting: Adult Health

## 2022-09-04 ENCOUNTER — Encounter: Payer: Self-pay | Admitting: Family Medicine

## 2022-11-14 ENCOUNTER — Ambulatory Visit (INDEPENDENT_AMBULATORY_CARE_PROVIDER_SITE_OTHER): Payer: Self-pay

## 2022-11-14 ENCOUNTER — Ambulatory Visit
Admission: EM | Admit: 2022-11-14 | Discharge: 2022-11-14 | Disposition: A | Discharge status: Home / Self Care | Payer: Self-pay | Attending: Internal Medicine | Admitting: Internal Medicine

## 2022-11-14 DIAGNOSIS — J208 Acute bronchitis due to other specified organisms: Secondary | ICD-10-CM

## 2022-11-14 DIAGNOSIS — R053 Chronic cough: Secondary | ICD-10-CM

## 2022-11-14 DIAGNOSIS — Z902 Acquired absence of lung [part of]: Secondary | ICD-10-CM

## 2022-11-14 DIAGNOSIS — B9689 Other specified bacterial agents as the cause of diseases classified elsewhere: Secondary | ICD-10-CM

## 2022-11-14 DIAGNOSIS — J4531 Mild persistent asthma with (acute) exacerbation: Secondary | ICD-10-CM

## 2022-11-14 DIAGNOSIS — R059 Cough, unspecified: Secondary | ICD-10-CM

## 2022-11-14 MED ORDER — CETIRIZINE HCL 10 MG PO TABS: 10.0000 mg | ORAL_TABLET | Freq: Every day | ORAL | 0 refills | Status: DC

## 2022-11-14 MED ORDER — FLUTICASONE PROPIONATE 50 MCG/ACT NA SUSP: 2.0000 | Freq: Every day | NASAL | 0 refills | Status: DC
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
## 2022-11-14 NOTE — Discharge Instructions (Addendum)
I will call you with your chest x-ray results when we receive the report and update your diagnoses and treatment plan as necessary.  For now you can keep using the medications you are using and adding Zyrtec and Flonase.

## 2022-11-14 NOTE — ED Triage Notes (Signed)
Pt presents with c/o a cough x 1 month. States she has taken Nyquil and Mucinex.

## 2022-11-23 ENCOUNTER — Ambulatory Visit
Admission: EM | Admit: 2022-11-23 | Discharge: 2022-11-23 | Disposition: A | Discharge status: Home / Self Care | Payer: Self-pay | Attending: Internal Medicine | Admitting: Internal Medicine

## 2022-11-23 DIAGNOSIS — B9689 Other specified bacterial agents as the cause of diseases classified elsewhere: Secondary | ICD-10-CM

## 2022-11-23 DIAGNOSIS — J208 Acute bronchitis due to other specified organisms: Secondary | ICD-10-CM

## 2022-11-23 DIAGNOSIS — Z3A09 9 weeks gestation of pregnancy: Secondary | ICD-10-CM

## 2022-11-23 DIAGNOSIS — J4531 Mild persistent asthma with (acute) exacerbation: Secondary | ICD-10-CM

## 2022-11-23 DIAGNOSIS — R053 Chronic cough: Secondary | ICD-10-CM

## 2022-11-23 MED ORDER — PREDNISONE 20 MG PO TABS: ORAL_TABLET | ORAL | 0 refills | Status: DC

## 2022-11-23 NOTE — ED Triage Notes (Addendum)
Pt c/o CP/epigastric, upper abd pain x 4 days-worse when she coughs and takes a deep breath/worse along bra line-was seen here 8/17 for cough c/o-states no improvement with meds-pt states she is 9 weeks preg based on LMP-NAD-steady gait

## 2022-11-23 NOTE — Discharge Instructions (Signed)
I would like for you to continue to your albuterol inhaler, Zyrtec. Given your lung history, persistent cough, chest pain then let's use prednisone. Please clear the use of prednisone with your OB/GYN before starting it.
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
## 2023-02-01 ENCOUNTER — Telehealth: Payer: Self-pay | Admitting: Physician Assistant

## 2023-02-01 DIAGNOSIS — B001 Herpesviral vesicular dermatitis: Secondary | ICD-10-CM

## 2023-02-01 MED ORDER — VALACYCLOVIR HCL 1 G PO TABS
2000.0000 mg | ORAL_TABLET | Freq: Two times a day (BID) | ORAL | 1 refills | Status: AC
Start: 2023-02-01 — End: 2023-02-02

## 2023-02-01 NOTE — Progress Notes (Signed)
Virtual Visit Consent   Pam Clark, you are scheduled for a virtual visit with a [facility] provider today. Just as with appointments in the office, your consent must be obtained to participate. Your consent will be active for this visit and any virtual visit you may have with one of our providers in the next 365 days. If you have a MyChart account, a copy of this consent can be sent to you electronically.  As this is a virtual visit, video technology does not allow for your provider to perform a traditional examination. This may limit your provider's ability to fully assess your condition. If your provider identifies any concerns that need to be evaluated in person or the need to arrange testing (such as labs, EKG, etc.), we will make arrangements to do so. Although advances in technology are sophisticated, we cannot ensure that it will always work on either your end or our end. If the connection with a video visit is poor, the visit may have to be switched to a telephone visit. With either a video or telephone visit, we are not always able to ensure that we have a secure connection.  By engaging in this virtual visit, you consent to the provision of healthcare and authorize for your insurance to be billed (if applicable) for the services provided during this visit. Depending on your insurance coverage, you may receive a charge related to this service.  I need to obtain your verbal consent now. Are you willing to proceed with your visit today? Pam Clark has provided verbal consent on 02/01/2023 for a virtual visit (video or telephone). [name], PA-C  Date: 02/01/2023 7:48 AM  Virtual Visit via Video Note   I, [name], connected with  Pam Clark  ([id number], [birth year]) on 02/01/23 at  7:45 AM EST by a video-enabled telemedicine application and verified that I am speaking with the correct person using two identifiers.  Location: Patient:  Virtual Visit Location Patient: Home Provider: Virtual Visit Location Provider: Home Office   I discussed the limitations of evaluation and management by telemedicine and the availability of in person appointments. The patient expressed understanding and agreed to proceed.    History of Present Illness: Pam Clark is a 27 y.o. who identifies as a female who was assigned female at birth, and is being seen today for cold sores.   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   Bronchiectasis without complication (HCC) 10/09/2019    Allergies:  Allergies  Allergen Reactions   Doxycycline Nausea And Vomiting   Medications:  Current Outpatient Medications:    albuterol (VENTOLIN HFA) 108 (90 Base) MCG/ACT inhaler, Inhale 1-2 puffs into the lungs every 6 (six) hours as needed., Disp: 18 g, Rfl: 0   cetirizine (ZYRTEC ALLERGY) 10 MG tablet, Take 1 tablet (10 mg total) by mouth daily., Disp: 30 tablet, Rfl: 0   fluticasone (FLONASE) 50 MCG/ACT nasal spray, Place 2 sprays into both nostrils daily., Disp: 16 g, Rfl: 0   fluticasone (FLOVENT HFA) 110 MCG/ACT inhaler, Inhale 1 puff into the lungs in the morning and at bedtime., Disp: 12 g, Rfl: 0   predniSONE (DELTASONE) 20 MG tablet, Take 2 tablets daily with breakfast., Disp: 10 tablet, Rfl: 0   valACYclovir (VALTREX) 1000 MG tablet, Take 2 tablets (  2,000 mg total) by mouth 2 (two) times daily for 1 day., Disp: 4 tablet, Rfl: 1  Observations/Objective: Patient is well-developed, well-nourished in no acute distress.  Resting comfortably at home.  Head is normocephalic, atraumatic.  No labored breathing.  Speech is clear and coherent with logical content.  Patient is alert and oriented at baseline.    Assessment and Plan: 1. Cold sore - valACYclovir (VALTREX) 1000  MG tablet; Take 2 tablets (2,000 mg total) by mouth 2 (two) times daily for 1 day.  Dispense: 4 tablet; Refill: 1  - Valtrex prescribed - Moisturize well - Seek further evaluation if not improving or if symptoms worsen  Follow Up Instructions: I discussed the assessment and treatment plan with the patient. The patient was provided an opportunity to ask questions and all were answered. The patient agreed with the plan and demonstrated an understanding of the instructions.  A copy of instructions were sent to the patient via MyChart unless otherwise noted below.    The patient was advised to call back or seek an in-person evaluation if the symptoms worsen or if the condition fails to improve as anticipated.    [name], PA-C

## 2023-02-01 NOTE — Patient Instructions (Signed)
Pam Clark, thank you for joining [name], PA-C for today's virtual visit.  While this provider is not your primary care provider (PCP), if your PCP is located in our provider database this encounter information will be shared with them immediately following your visit.   A [facility] MyChart account gives you access to today's visit and all your visits, tests, and labs performed at [facility] " click here if you don't have a [facility] MyChart account or go to [email or web address]  Consent: (Patient) Pam Clark provided verbal consent for this virtual visit at the beginning of the encounter.  Current Medications:  Current Outpatient Medications:    valACYclovir (VALTREX) 1000 MG tablet, Take 2 tablets (2,000 mg total) by mouth 2 (two) times daily for 1 day., Disp: 4 tablet, Rfl: 1   albuterol (VENTOLIN HFA) 108 (90 Base) MCG/ACT inhaler, Inhale 1-2 puffs into the lungs every 6 (six) hours as needed., Disp: 18 g, Rfl: 0   cetirizine (ZYRTEC ALLERGY) 10 MG tablet, Take 1 tablet (10 mg total) by mouth daily., Disp: 30 tablet, Rfl: 0   fluticasone (FLONASE) 50 MCG/ACT nasal spray, Place 2 sprays into both nostrils daily., Disp: 16 g, Rfl: 0   fluticasone (FLOVENT HFA) 110 MCG/ACT inhaler, Inhale 1 puff into the lungs in the morning and at bedtime., Disp: 12 g, Rfl: 0   predniSONE (DELTASONE) 20 MG tablet, Take 2 tablets daily with breakfast., Disp: 10 tablet, Rfl: 0   Medications ordered in this encounter:  Meds ordered this encounter  Medications   valACYclovir (VALTREX) 1000 MG tablet    Sig: Take 2 tablets (2,000 mg total) by mouth 2 (two) times daily for 1 day.    Dispense:  4 tablet    Refill:  1    Order Specific Question:   Supervising Provider    Answer:   Merrilee Jansky [id number]     *If you need refills on other medications prior to your next appointment, please contact your pharmacy*  Follow-Up: Call back or seek an  in-person evaluation if the symptoms worsen or if the condition fails to improve as anticipated.  [facility] [phone]  Other Instructions Cold Sore  A cold sore, also called a fever blister, is a small, fluid-filled sore that forms inside the mouth or on the lips, gums, nose, chin, or cheeks. Cold sores can spread to other parts of the body, such as the eyes, fingers, or genitals. Cold sores can spread from person to person (are contagious) until the sores crust over completely. Most cold sores go away within 2 weeks. What are the causes? Cold sores are caused by an infection from a common type of herpes simplex virus (HSV-1). HSV-1 is closely related to the HSV-2virus, which is the virus that causes genital herpes, but these viruses are not the same. Once a person is infected with HSV-1, the virus remains permanently in the body. HSV-1 is spread from person to person through close contact, such as through kissing, touching the affected area, or sharing personal items such as lip balm, razors, a drinking glass, or eating utensils. What increases the risk? You are more likely to develop this condition if you: Are tired, stressed, or sick. Are menstruating. Are pregnant. Take certain medicines. Are exposed to cold weather or too much sun. What are the signs or symptoms? Symptoms of a cold sore outbreak go through different stages. These are the stages of a cold sore: Tingling, itching,  or burning is felt 1-2 days before the outbreak. Fluid-filled blisters appear on the lips, inside the mouth, on the nose, or on the cheeks. The blisters start to ooze clear fluid. The blisters dry up, and a yellow crust appears in their place. The crust falls off. In some cases, other symptoms can develop during a cold sore outbreak. These can include: Fever. Sore throat. Headache. Muscle aches. Swollen neck glands. How is this diagnosed? This condition is diagnosed based on your  medical history and a physical exam. Your health care provider may do a blood test or may swab some fluid from your sore and then examine the swab in the lab. How is this treated? There is no cure for cold sores or HSV-1. There is also no vaccine for HSV-1. Most cold sores go away on their own without treatment within 2 weeks. Medicines cannot make the infection go away, but your health care provider may prescribe medicines to: Help relieve some of the pain associated with the sores. Work to stop the virus from multiplying. Shorten healing time. Medicines may be in the form of creams, gels, pills, or a shot. Follow these instructions at home: Medicines Take or apply over-the-counter and prescription medicines only as told by your health care provider. Use a cotton-tip swab to apply creams or gels to your sores. Ask your health care provider if you can take lysine supplements. Research has found that lysine may help heal the cold sore faster and prevent outbreaks. Sore care  Do not touch the sores or pick the scabs. Wash your hands often with soap and water for at least 20 seconds. Do not touch your eyes without washing your hands first. Keep the sores clean and dry. If directed, put ice on the sores. To do this: Put ice in a plastic bag. Place a towel between your skin and the bag. Leave the ice on for 20 minutes, 2-3 times a day. Remove the ice if your skin turns bright red. This is very important. If you cannot feel pain, heat, or cold, you have a greater risk of damage to the area. Eating and drinking Eat a soft, bland diet. Avoid eating hot, cold, or salty foods. Use a straw if it hurts to drink out of a glass. Eat foods that are rich in lysine, such as meat, fish, and dairy products. Avoid sugary foods, chocolates, nuts, and grains. These foods are rich in a nutrient called arginine, which can cause the virus to multiply. Lifestyle Do not kiss, have oral sex, or share personal items  until your sores heal. Stress, poor sleep, and being out in the sun can trigger outbreaks. Make sure you: Do activities that help you relax, such as deep breathing exercises or meditation. Get enough sleep. Apply sunscreen on your lips before you go out in the sun. Contact a health care provider if: You have symptoms for more than 2 weeks. You have pus coming from the sores. You have redness that is spreading. You have pain or irritation in your eye. You get sores on your genitals. Your sores do not heal within 2 weeks. You have frequent cold sore outbreaks. Get help right away if: You have a fever and your symptoms suddenly get worse. You have a headache and confusion. You have tiredness (fatigue) or loss of appetite. You have a stiff neck or sensitivity to light. Summary A cold sore, also called a fever blister, is a small, fluid-filled sore that forms inside the mouth or  on the lips, gums, nose, chin, or cheeks. Most cold sores go away on their own without treatment within 2 weeks. Your health care provider may prescribe medicines to help relieve some of the pain, work to stop the virus from multiplying, and shorten healing time. Wash your hands often with soap and water for at least 20 seconds. Do not touch your eyes without washing your hands first. Do not kiss, have oral sex, or share personal items until your sores heal. Contact a health care provider if your sores do not heal within 2 weeks. This information is not intended to replace advice given to you by your health care provider. Make sure you discuss any questions you have with your health care provider. Document Revised: 12/25/2020 Document Reviewed: 12/25/2020 Elsevier Patient Education  2024 Elsevier Inc.    If you have been instructed to have an in-person evaluation today at a local Urgent Care facility, please use the link below. It will take you to a list of all of our available [facility], including  address, phone number and hours of operation. Please do not delay care.  [facility]  If you or a family member do not have a primary care provider, use the link below to schedule a visit and establish care. When you choose a [facility] primary care physician or advanced practice provider, you gain a long-term partner in health. Find a Primary Care Provider  Learn more about [facility]'s in-office and virtual care options: [facility] - Get Care Now

## 2023-03-06 ENCOUNTER — Telehealth: Payer: Self-pay | Admitting: Nurse Practitioner

## 2023-03-06 DIAGNOSIS — N898 Other specified noninflammatory disorders of vagina: Secondary | ICD-10-CM

## 2023-03-06 MED ORDER — IBUPROFEN 600 MG PO TABS: 600.0000 mg | ORAL_TABLET | Freq: Three times a day (TID) | ORAL | 0 refills | Status: DC | PRN

## 2023-03-06 NOTE — Patient Instructions (Signed)
  Pam Clark, thank you for joining Claiborne Rigg, NP for today's virtual visit.  While this provider is not your primary care provider (PCP), if your PCP is located in our provider database this encounter information will be shared with them immediately following your visit.   A [facility] MyChart account gives you access to today's visit and all your visits, tests, and labs performed at [facility] " click here if you don't have a [facility] MyChart account or go to [email or web address]  Consent: (Patient) Pam Clark provided verbal consent for this virtual visit at the beginning of the encounter.  Current Medications:  Current Outpatient Medications:    ibuprofen (ADVIL) 600 MG tablet, Take 1 tablet (600 mg total) by mouth every 8 (eight) hours as needed., Disp: 30 tablet, Rfl: 0   albuterol (VENTOLIN HFA) 108 (90 Base) MCG/ACT inhaler, Inhale 1-2 puffs into the lungs every 6 (six) hours as needed., Disp: 18 g, Rfl: 0   cetirizine (ZYRTEC ALLERGY) 10 MG tablet, Take 1 tablet (10 mg total) by mouth daily., Disp: 30 tablet, Rfl: 0   fluticasone (FLONASE) 50 MCG/ACT nasal spray, Place 2 sprays into both nostrils daily., Disp: 16 g, Rfl: 0   fluticasone (FLOVENT HFA) 110 MCG/ACT inhaler, Inhale 1 puff into the lungs in the morning and at bedtime., Disp: 12 g, Rfl: 0   predniSONE (DELTASONE) 20 MG tablet, Take 2 tablets daily with breakfast., Disp: 10 tablet, Rfl: 0   Medications ordered in this encounter:  Meds ordered this encounter  Medications   ibuprofen (ADVIL) 600 MG tablet    Sig: Take 1 tablet (600 mg total) by mouth every 8 (eight) hours as needed.    Dispense:  30 tablet    Refill:  0    Order Specific Question:   Supervising Provider    Answer:   Merrilee Jansky [id number]     *If you need refills on other medications prior to your next appointment, please contact your pharmacy*  Follow-Up: Call back or seek an in-person evaluation  if the symptoms worsen or if the condition fails to improve as anticipated.  [facility] [phone]  Other Instructions Continue warm compresses and ibuprofen as prescribed. If not improvement will need in person evaluation    If you have been instructed to have an in-person evaluation today at a local Urgent Care facility, please use the link below. It will take you to a list of all of our available [facility], including address, phone number and hours of operation. Please do not delay care.  [facility]  If you or a family member do not have a primary care provider, use the link below to schedule a visit and establish care. When you choose a [facility] primary care physician or advanced practice provider, you gain a long-term partner in health. Find a Primary Care Provider  Learn more about [facility]'s in-office and virtual care options: [facility] - Get Care Now

## 2023-03-06 NOTE — Progress Notes (Signed)
Virtual Visit Consent   Pam Clark, you are scheduled for a virtual visit with a [facility] provider today. Just as with appointments in the office, your consent must be obtained to participate. Your consent will be active for this visit and any virtual visit you may have with one of our providers in the next 365 days. If you have a MyChart account, a copy of this consent can be sent to you electronically.  As this is a virtual visit, video technology does not allow for your provider to perform a traditional examination. This may limit your provider's ability to fully assess your condition. If your provider identifies any concerns that need to be evaluated in person or the need to arrange testing (such as labs, EKG, etc.), we will make arrangements to do so. Although advances in technology are sophisticated, we cannot ensure that it will always work on either your end or our end. If the connection with a video visit is poor, the visit may have to be switched to a telephone visit. With either a video or telephone visit, we are not always able to ensure that we have a secure connection.  By engaging in this virtual visit, you consent to the provision of healthcare and authorize for your insurance to be billed (if applicable) for the services provided during this visit. Depending on your insurance coverage, you may receive a charge related to this service.  I need to obtain your verbal consent now. Are you willing to proceed with your visit today? Terika Niskanen has provided verbal consent on 03/06/2023 for a virtual visit (video or telephone). Claiborne Rigg, NP  Date: 03/06/2023 5:23 PM  Virtual Visit via Video Note   I, Claiborne Rigg, connected with  Pam Clark  ([id number], 09/14/1995) on 03/06/23 at  5:15 PM EST by a video-enabled telemedicine application and verified that I am speaking with the correct person using two identifiers.  Location: Patient: Virtual Visit  Location Patient: Home Provider: Virtual Visit Location Provider: Home Office   I discussed the limitations of evaluation and management by telemedicine and the availability of in person appointments. The patient expressed understanding and agreed to proceed.    History of Present Illness: Pam Clark is a 27 y.o. who identifies as a female who was assigned female at birth, and is being seen today for GU problem.  Ms. Logemann endorses an area on the right side of her vagina adjacent to the perineal area that is swollen and painful. Symptoms started 3 days ago. There is no drainage from the area. She states her menstrual cycle is also on currently. Using ibuprofen, epsom salt, warm compresses for relief.   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   Bronchiectasis without complication (HCC) 10/09/2019    Allergies:  Allergies  Allergen Reactions   Doxycycline Nausea And Vomiting   Medications:  Current Outpatient Medications:    ibuprofen (ADVIL) 600 MG tablet, Take 1 tablet (600 mg total) by mouth every 8 (eight) hours as needed., Disp: 30 tablet, Rfl: 0   albuterol (VENTOLIN HFA) 108 (90 Base) MCG/ACT inhaler, Inhale 1-2 puffs into the lungs every 6 (six) hours as needed., Disp: 18 g, Rfl: 0   cetirizine (ZYRTEC ALLERGY) 10 MG tablet, Take 1 tablet (10 mg total) by mouth daily.,  Disp: 30 tablet, Rfl: 0   fluticasone (FLONASE) 50 MCG/ACT nasal spray, Place 2 sprays into both nostrils daily., Disp: 16 g, Rfl: 0   fluticasone (FLOVENT HFA) 110 MCG/ACT inhaler, Inhale 1 puff into the lungs in the morning and at bedtime., Disp: 12 g, Rfl: 0   predniSONE (DELTASONE) 20 MG tablet, Take 2 tablets daily with breakfast., Disp: 10 tablet, Rfl: 0  Observations/Objective: Patient is well-developed, well-nourished in no  acute distress.  Resting comfortably at home.  Head is normocephalic, atraumatic.  No labored breathing.  Speech is clear and coherent with logical content.  Patient is alert and oriented at baseline.    Assessment and Plan: 1. Vaginal cyst - ibuprofen (ADVIL) 600 MG tablet; Take 1 tablet (600 mg total) by mouth every 8 (eight) hours as needed.  Dispense: 30 tablet; Refill: 0 Continue warm compresses and ibuprofen as prescribed. If not improvement will need in person evaluation   Follow Up Instructions: I discussed the assessment and treatment plan with the patient. The patient was provided an opportunity to ask questions and all were answered. The patient agreed with the plan and demonstrated an understanding of the instructions.  A copy of instructions were sent to the patient via MyChart unless otherwise noted below.    The patient was advised to call back or seek an in-person evaluation if the symptoms worsen or if the condition fails to improve as anticipated.    Claiborne Rigg, NP

## 2023-03-19 ENCOUNTER — Telehealth: Payer: Self-pay | Admitting: Family Medicine

## 2023-03-19 DIAGNOSIS — N764 Abscess of vulva: Secondary | ICD-10-CM

## 2023-03-19 DIAGNOSIS — L0291 Cutaneous abscess, unspecified: Secondary | ICD-10-CM

## 2023-03-19 MED ORDER — SULFAMETHOXAZOLE-TRIMETHOPRIM 800-160 MG PO TABS: 1.0000 | ORAL_TABLET | Freq: Two times a day (BID) | ORAL | 0 refills | Status: AC

## 2023-03-19 NOTE — Patient Instructions (Signed)
Skin Abscess  A skin abscess is an infected area on or under your skin. It contains pus and other material. An abscess may also be called a furuncle, carbuncle, or boil. It is often the result of an infection caused by bacteria. An abscess can occur in or on almost any part of your body. Sometimes, an abscess may break open (rupture) on its own. In most cases, it will keep getting worse unless it is treated. An abscess can cause pain and make you feel ill. An untreated abscess can cause infection to spread to other parts of your body or your bloodstream. The abscess may need to be drained. You may also need to take antibiotics. What are the causes? An abscess occurs when germs, like bacteria, pass through your skin and cause an infection. This may be caused by: A scrape or cut on your skin. A puncture wound through your skin, such as a needle injection or insect bite. Blocked oil or sweat glands. Blocked and infected hair follicles. A fluid-filled sac that forms beneath your skin (sebaceous cyst) and becomes infected. What increases the risk? You may be more likely to develop an abscess if: You have problems with blood circulation, or you have a weak body defense system (immune system). You have diabetes. You have dry and irritated skin. You get injections often or use IV drugs. You have a foreign body in a wound, such as a splinter. You smoke or use tobacco products. What are the signs or symptoms? Symptoms of this condition include: A painful, firm bump under the skin. A bump with pus at the top. This may break through the skin and drain. Other symptoms include: Redness and swelling around the abscess. Warmth or tenderness. Swelling of the lymph nodes (glands) near the abscess. A sore on the skin. How is this diagnosed? This condition may be diagnosed based on a physical exam and your medical history. You may also have tests done, such as: A test of a sample of pus. This may be done  to find what is causing the infection. Blood tests. Imaging tests, such as an ultrasound, CT scan, or MRI. How is this treated? A small abscess that drains on its own may not need to be treated. Treatment for larger abscesses may include: Moist heat or a heat pack applied to the area a few times a day. Incision and drainage. This is a procedure to drain the abscess. Antibiotics. For a severe abscess, you may first get antibiotics through an IV and then change to antibiotics by mouth. Follow these instructions at home: Medicines Take over-the-counter and prescription medicines only as told by your provider. If you were prescribed antibiotics, take them as told by your provider. Do not stop using the antibiotic even if you start to feel better. Abscess care  If you have an abscess that has not drained, apply heat to the affected area. Use the heat source that your provider recommends, such as a moist heat pack or a heating pad. Place a towel between your skin and the heat source. Leave the heat on for 20-30 minutes at a time. If your skin turns bright red, remove the heat right away to prevent burns. The risk of burns is higher if you cannot feel pain, heat, or cold. Follow instructions from your provider about how to take care of your abscess. Make sure you: Cover the abscess with a bandage (dressing). Wash your hands with soap and water for at least 20 seconds before  and after you change the dressing or gauze. If soap and water are not available, use hand sanitizer. Change your dressing or gauze as told by your provider. Check your abscess every day for signs of an infection that is getting worse. Check for: More redness, swelling, pain, or tenderness. More fluid or blood. Warmth. More pus or a worse smell. General instructions To avoid spreading the infection: Do not share personal care items, towels, or hot tubs with others. Avoid making skin contact with other people. Be careful  when getting rid of used dressings, wound packing, or any drainage from the abscess. Do not use any products that contain nicotine or tobacco. These products include cigarettes, chewing tobacco, and vaping devices, such as e-cigarettes. If you need help quitting, ask your provider. Do not use any creams, ointments, or liquids unless you have been told to by your provider. Contact a health care provider if: You see redness that spreads quickly or red streaks on your skin spreading away from the abscess. You have any signs of worse infection at the abscess. You vomit every time you eat or drink. You have a fever, chills, or muscle aches. The cyst or abscess returns. Get help right away if: You have severe pain. You make less pee (urine) than normal. This information is not intended to replace advice given to you by your health care provider. Make sure you discuss any questions you have with your health care provider. Document Revised: 10/29/2021 Document Reviewed: 10/29/2021 Elsevier Patient Education  2024 [employer].

## 2023-03-19 NOTE — Progress Notes (Signed)
Virtual Visit Consent   Pam Clark, you are scheduled for a virtual visit with a [facility] provider today. Just as with appointments in the office, your consent must be obtained to participate. Your consent will be active for this visit and any virtual visit you may have with one of our providers in the next 365 days. If you have a MyChart account, a copy of this consent can be sent to you electronically.  As this is a virtual visit, video technology does not allow for your provider to perform a traditional examination. This may limit your provider's ability to fully assess your condition. If your provider identifies any concerns that need to be evaluated in person or the need to arrange testing (such as labs, EKG, etc.), we will make arrangements to do so. Although advances in technology are sophisticated, we cannot ensure that it will always work on either your end or our end. If the connection with a video visit is poor, the visit may have to be switched to a telephone visit. With either a video or telephone visit, we are not always able to ensure that we have a secure connection.  By engaging in this virtual visit, you consent to the provision of healthcare and authorize for your insurance to be billed (if applicable) for the services provided during this visit. Depending on your insurance coverage, you may receive a charge related to this service.  I need to obtain your verbal consent now. Are you willing to proceed with your visit today? Pam Clark has provided verbal consent on 03/19/2023 for a virtual visit (video or telephone). Pam Curio, FNP  Date: 03/19/2023 8:08 AM  Virtual Visit via Video Note   I, Pam Clark, connected with  Pam Clark  ([id number], 02-06-1996) on 03/19/23 at  8:00 AM EST by a video-enabled telemedicine application and verified that I am speaking with the correct person using two identifiers.  Location: Patient: Virtual Visit  Location Patient: Home Provider: Virtual Visit Location Provider: Home Office   I discussed the limitations of evaluation and management by telemedicine and the availability of in person appointments. The patient expressed understanding and agreed to proceed.    History of Present Illness: Pam Clark is a 27 y.o. who identifies as a female who was assigned female at birth, and is being seen today for an abscess on labia. She says she took keflex and it got better then came back. She says it is does not appear to need to be drained. Denies fever. Marland Kitchen  HPI: HPI  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   Bronchiectasis without complication (HCC) 10/09/2019    Allergies:  Allergies  Allergen Reactions   Doxycycline Nausea And Vomiting   Medications:  Current Outpatient Medications:    sulfamethoxazole-trimethoprim (BACTRIM DS) 800-160 MG tablet, Take 1 tablet by mouth 2 (two) times daily for 10 days., Disp: 20 tablet, Rfl: 0   albuterol (VENTOLIN HFA) 108 (90 Base) MCG/ACT inhaler, Inhale 1-2 puffs into the lungs every 6 (six) hours as needed., Disp: 18 g, Rfl: 0   cetirizine (ZYRTEC ALLERGY) 10 MG tablet, Take 1 tablet (10 mg total) by mouth daily., Disp: 30 tablet, Rfl: 0   fluticasone (FLONASE) 50 MCG/ACT nasal spray, Place 2 sprays into both nostrils daily., Disp: 16 g, Rfl:  0   fluticasone (FLOVENT HFA) 110 MCG/ACT inhaler, Inhale 1 puff into the lungs in the morning and at bedtime., Disp: 12 g, Rfl: 0   ibuprofen (ADVIL) 600 MG tablet, Take 1 tablet (600 mg total) by mouth every 8 (eight) hours as needed., Disp: 30 tablet, Rfl: 0   predniSONE (DELTASONE) 20 MG tablet, Take 2 tablets daily with breakfast., Disp: 10 tablet, Rfl: 0  Observations/Objective: Patient is well-developed, well-nourished in no  acute distress.  Resting comfortably  at home.  Head is normocephalic, atraumatic.  No labored breathing.  Speech is clear and coherent with logical content.  Patient is alert and oriented at baseline.    Assessment and Plan: 1. Abscess (Primary)  2. Abscess of labia  Warm soaks, see GYN or urgent care if sx persist or worsen.   Follow Up Instructions: I discussed the assessment and treatment plan with the patient. The patient was provided an opportunity to ask questions and all were answered. The patient agreed with the plan and demonstrated an understanding of the instructions.  A copy of instructions were sent to the patient via MyChart unless otherwise noted below.     The patient was advised to call back or seek an in-person evaluation if the symptoms worsen or if the condition fails to improve as anticipated.    Pam Curio, FNP

## 2023-05-10 ENCOUNTER — Telehealth: Payer: Self-pay | Admitting: Physician Assistant

## 2023-05-10 ENCOUNTER — Telehealth: Payer: Self-pay

## 2023-05-10 DIAGNOSIS — N939 Abnormal uterine and vaginal bleeding, unspecified: Secondary | ICD-10-CM

## 2023-05-10 NOTE — Progress Notes (Signed)
 Virtual Visit Consent   Pam Clark, you are scheduled for a virtual visit with a [facility] provider today. Just as with appointments in the office, your consent must be obtained to participate. Your consent will be active for this visit and any virtual visit you may have with one of our providers in the next 365 days. If you have a MyChart account, a copy of this consent can be sent to you electronically.  As this is a virtual visit, video technology does not allow for your provider to perform a traditional examination. This may limit your provider's ability to fully assess your condition. If your provider identifies any concerns that need to be evaluated in person or the need to arrange testing (such as labs, EKG, etc.), we will make arrangements to do so. Although advances in technology are sophisticated, we cannot ensure that it will always work on either your end or our end. If the connection with a video visit is poor, the visit may have to be switched to a telephone visit. With either a video or telephone visit, we are not always able to ensure that we have a secure connection.  By engaging in this virtual visit, you consent to the provision of healthcare and authorize for your insurance to be billed (if applicable) for the services provided during this visit. Depending on your insurance coverage, you may receive a charge related to this service.  I need to obtain your verbal consent now. Are you willing to proceed with your visit today? Pam Clark has provided verbal consent on 05/10/2023 for a virtual visit (video or telephone). Char Common Ward, PA-C  Date: 05/10/2023 6:20 PM   Virtual Visit via Video Note   I, Char Common Ward, connected with  Pam Clark  ([id number], 22-Apr-1995) on 05/10/23 at  6:15 PM EST by a video-enabled telemedicine application and verified that I am speaking with the correct person using two identifiers.  Location: Patient: Virtual  Visit Location Patient: Home Provider: Virtual Visit Location Provider: Home Office   I discussed the limitations of evaluation and management by telemedicine and the availability of in person appointments. The patient expressed understanding and agreed to proceed.    History of Present Illness: Pam Clark is a 28 y.o. who identifies as a female who was assigned female at birth, and is being seen today for questions regarding vaginal spotting. Pt reports she is trying to conceive and stopped birth control in January. LMP 04/23/23, this was after stopping BC.  Reports light spotting that started yesterday, reports light red.  Denies abdominal pain.  Has not taken a pregnancy test.   HPI: HPI  Problems:  Patient Active Problem List   Diagnosis Date Noted   Chlamydia 04/21/2021   Nexplanon  removal 04/16/2021   Screen for STD (sexually transmitted disease) 04/16/2021   S/P lobectomy of lung 01/29/2020   Bronchiectasis without complication (HCC) 10/09/2019    Allergies:  Allergies  Allergen Reactions   Doxycycline  Nausea And Vomiting   Medications:  Current Outpatient Medications:    albuterol  (VENTOLIN  HFA) 108 (90 Base) MCG/ACT inhaler, Inhale 1-2 puffs into the lungs every 6 (six) hours as needed., Disp: 18 g, Rfl: 0   cetirizine  (ZYRTEC  ALLERGY) 10 MG tablet, Take 1 tablet (10 mg total) by mouth daily., Disp: 30 tablet, Rfl: 0   fluticasone  (FLONASE ) 50 MCG/ACT nasal spray, Place 2 sprays into both nostrils daily., Disp: 16 g, Rfl: 0   fluticasone  (FLOVENT  HFA) 110  MCG/ACT inhaler, Inhale 1 puff into the lungs in the morning and at bedtime., Disp: 12 g, Rfl: 0   ibuprofen  (ADVIL ) 600 MG tablet, Take 1 tablet (600 mg total) by mouth every 8 (eight) hours as needed., Disp: 30 tablet, Rfl: 0   predniSONE  (DELTASONE ) 20 MG tablet, Take 2 tablets daily with breakfast., Disp: 10 tablet, Rfl: 0  Observations/Objective: Patient is well-developed, well-nourished in no acute distress.   Resting comfortably at home.  Head is normocephalic, atraumatic.  No labored breathing.  Speech is clear and coherent with logical content.  Patient is alert and oriented at baseline.    Assessment and Plan: 1. Vaginal spotting (Primary)  Advised to take a pregnancy test and follow up with OBGYN.   Follow Up Instructions: I discussed the assessment and treatment plan with the patient. The patient was provided an opportunity to ask questions and all were answered. The patient agreed with the plan and demonstrated an understanding of the instructions.  A copy of instructions were sent to the patient via MyChart unless otherwise noted below.     The patient was advised to call back or seek an in-person evaluation if the symptoms worsen or if the condition fails to improve as anticipated.    Char Common Ward, PA-C

## 2023-05-10 NOTE — Patient Instructions (Signed)
  Pam Clark, thank you for joining Char Common Ward, PA-C for today's virtual visit.  While this provider is not your primary care provider (PCP), if your PCP is located in our provider database this encounter information will be shared with them immediately following your visit.   A [facility] MyChart account gives you access to today's visit and all your visits, tests, and labs performed at [facility] " click here if you don't have a [facility] MyChart account or go to [email or web address]  Consent: (Patient) Pam Clark provided verbal consent for this virtual visit at the beginning of the encounter.  Current Medications:  Current Outpatient Medications:    albuterol  (VENTOLIN  HFA) 108 (90 Base) MCG/ACT inhaler, Inhale 1-2 puffs into the lungs every 6 (six) hours as needed., Disp: 18 g, Rfl: 0   cetirizine  (ZYRTEC  ALLERGY) 10 MG tablet, Take 1 tablet (10 mg total) by mouth daily., Disp: 30 tablet, Rfl: 0   fluticasone  (FLONASE ) 50 MCG/ACT nasal spray, Place 2 sprays into both nostrils daily., Disp: 16 g, Rfl: 0   fluticasone  (FLOVENT  HFA) 110 MCG/ACT inhaler, Inhale 1 puff into the lungs in the morning and at bedtime., Disp: 12 g, Rfl: 0   ibuprofen  (ADVIL ) 600 MG tablet, Take 1 tablet (600 mg total) by mouth every 8 (eight) hours as needed., Disp: 30 tablet, Rfl: 0   predniSONE  (DELTASONE ) 20 MG tablet, Take 2 tablets daily with breakfast., Disp: 10 tablet, Rfl: 0   Medications ordered in this encounter:  No orders of the defined types were placed in this encounter.    *If you need refills on other medications prior to your next appointment, please contact your pharmacy*  Follow-Up: Call back or seek an in-person evaluation if the symptoms worsen or if the condition fails to improve as anticipated.  [facility] [phone]  Other Instructions After stopping birth control it can take a few months for your menstrual cycle to  become normal.   Recommend taking a home pregnancy test, if positive please follow up with OBGYN.    If you have been instructed to have an in-person evaluation today at a local Urgent Care facility, please use the link below. It will take you to a list of all of our available [facility], including address, phone number and hours of operation. Please do not delay care.  [facility]  If you or a family member do not have a primary care provider, use the link below to schedule a visit and establish care. When you choose a [facility] primary care physician or advanced practice provider, you gain a long-term partner in health. Find a Primary Care Provider  Learn more about [facility]'s in-office and virtual care options: [facility] - Get Care Now

## 2023-07-02 ENCOUNTER — Encounter: Payer: Self-pay | Admitting: *Deleted

## 2023-07-02 ENCOUNTER — Ambulatory Visit (INDEPENDENT_AMBULATORY_CARE_PROVIDER_SITE_OTHER): Payer: Self-pay | Admitting: *Deleted

## 2023-07-02 ENCOUNTER — Other Ambulatory Visit: Payer: Self-pay | Admitting: Obstetrics & Gynecology

## 2023-07-02 VITALS — Ht 66.0 in | Wt 141.5 lb

## 2023-07-02 DIAGNOSIS — O3680X0 Pregnancy with inconclusive fetal viability, not applicable or unspecified: Secondary | ICD-10-CM

## 2023-07-02 DIAGNOSIS — Z3201 Encounter for pregnancy test, result positive: Secondary | ICD-10-CM

## 2023-07-02 LAB — POCT URINE PREGNANCY: Preg Test, Ur: POSITIVE — AB

## 2023-07-02 NOTE — Progress Notes (Signed)
   NURSE VISIT- PREGNANCY CONFIRMATION   SUBJECTIVE:  Pam Clark is a 28 y.o. [id number] female at [redacted]w[redacted]d by certain LMP of Patient's last menstrual period was 05/06/2023 (exact date). Here for pregnancy confirmation.  Home pregnancy test: positive x 1.   She reports cramping.  She is taking prenatal vitamins.    OBJECTIVE:  Ht 5\' 6"  (1.676 m)   Wt 141 lb 8 oz (64.2 kg)   LMP 05/06/2023 (Exact Date)   BMI 22.84 kg/m   Appears well, in no apparent distress  Results for orders placed or performed in visit on 07/02/23 (from the past 24 hours)  POCT urine pregnancy   Collection Time: 07/02/23 10:17 AM  Result Value Ref Range   Preg Test, Ur Positive (A) Negative    ASSESSMENT: Positive pregnancy test, [redacted]w[redacted]d by LMP    PLAN: Schedule for dating ultrasound ASAP!!! Prenatal vitamins: continue   Nausea medicines: not currently needed   OB packet given: Yes  Malachy Mood  07/02/2023 10:26 AM

## 2023-07-05 ENCOUNTER — Ambulatory Visit: Payer: Self-pay

## 2023-07-05 DIAGNOSIS — Z3491 Encounter for supervision of normal pregnancy, unspecified, first trimester: Secondary | ICD-10-CM

## 2023-07-05 DIAGNOSIS — O3680X0 Pregnancy with inconclusive fetal viability, not applicable or unspecified: Secondary | ICD-10-CM

## 2023-07-05 DIAGNOSIS — Z3A08 8 weeks gestation of pregnancy: Secondary | ICD-10-CM

## 2023-07-05 NOTE — Progress Notes (Signed)
 [place] 6+[redacted] wks gestational sac with yolk sac 15.6 mm=6+3 wks,no fetal pole visualized,normal ovaries,pt will come back in 10 days for F/U ultrasound

## 2023-07-13 ENCOUNTER — Other Ambulatory Visit: Payer: Self-pay | Admitting: Obstetrics & Gynecology

## 2023-07-13 DIAGNOSIS — O3680X0 Pregnancy with inconclusive fetal viability, not applicable or unspecified: Secondary | ICD-10-CM

## 2023-07-13 DIAGNOSIS — O26841 Uterine size-date discrepancy, first trimester: Secondary | ICD-10-CM

## 2023-07-19 ENCOUNTER — Other Ambulatory Visit: Payer: Self-pay | Admitting: Radiology

## 2023-10-15 ENCOUNTER — Ambulatory Visit
Admission: EM | Admit: 2023-10-15 | Discharge: 2023-10-15 | Disposition: A | Discharge status: Home / Self Care | Payer: Self-pay | Attending: Nurse Practitioner | Admitting: Nurse Practitioner

## 2023-10-15 DIAGNOSIS — Z113 Encounter for screening for infections with a predominantly sexual mode of transmission: Secondary | ICD-10-CM | POA: Insufficient documentation

## 2023-10-15 LAB — POCT URINE PREGNANCY: Preg Test, Ur: NEGATIVE

## 2023-10-15 NOTE — ED Triage Notes (Addendum)
 Pt reports her partner stated it burns with urination and they are sexually active. Last intercourse with this morning. Has multiple partners Pt denies sxs

## 2023-10-15 NOTE — Discharge Instructions (Signed)
 The urine pregnancy test was negative. Cytology swab results are pending.  You will be contacted if the pending test results are abnormal.  You also have access to your results via MyChart. Refrain from sexual intercourse until test results have been received.  If your test results are positive, please notify all partners. Increase condom use with each sexual encounter. Follow-up as needed.
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
## 2023-10-18 ENCOUNTER — Ambulatory Visit (HOSPITAL_COMMUNITY): Payer: Self-pay

## 2023-10-18 LAB — CERVICOVAGINAL ANCILLARY ONLY
Bacterial Vaginitis (gardnerella): POSITIVE — AB
Candida Glabrata: NEGATIVE
Candida Vaginitis: NEGATIVE
Chlamydia: NEGATIVE
Comment: NEGATIVE
Comment: NEGATIVE
Comment: NEGATIVE
Comment: NEGATIVE
Comment: NEGATIVE
Comment: NORMAL
Neisseria Gonorrhea: NEGATIVE
Trichomonas: NEGATIVE

## 2023-11-01 ENCOUNTER — Ambulatory Visit (INDEPENDENT_AMBULATORY_CARE_PROVIDER_SITE_OTHER): Payer: Self-pay | Admitting: Adult Health

## 2023-11-01 ENCOUNTER — Other Ambulatory Visit (HOSPITAL_COMMUNITY)
Admission: RE | Admit: 2023-11-01 | Discharge: 2023-11-01 | Disposition: A | Discharge status: Home / Self Care | Payer: Self-pay | Source: Ambulatory Visit | Attending: Adult Health | Admitting: Adult Health

## 2023-11-01 ENCOUNTER — Encounter: Payer: Self-pay | Admitting: Adult Health

## 2023-11-01 VITALS — BP 122/79 | HR 78 | Ht 66.0 in | Wt 141.5 lb

## 2023-11-01 DIAGNOSIS — N907 Vulvar cyst: Secondary | ICD-10-CM

## 2023-11-01 DIAGNOSIS — Z30011 Encounter for initial prescription of contraceptive pills: Secondary | ICD-10-CM

## 2023-11-01 DIAGNOSIS — Z3202 Encounter for pregnancy test, result negative: Secondary | ICD-10-CM

## 2023-11-01 DIAGNOSIS — Z124 Encounter for screening for malignant neoplasm of cervix: Secondary | ICD-10-CM | POA: Insufficient documentation

## 2023-11-01 LAB — POCT URINE PREGNANCY: Preg Test, Ur: NEGATIVE

## 2023-11-01 MED ORDER — LO LOESTRIN FE 1 MG-10 MCG / 10 MCG PO TABS
1.0000 | ORAL_TABLET | Freq: Every day | ORAL | 3 refills | Status: DC
Start: 2023-11-01 — End: 2024-01-03

## 2023-11-01 NOTE — Progress Notes (Signed)
  Subjective:     Patient ID: Pam Clark, female   DOB: October 18, 1995, 28 y.o.   MRN: [id number]  HPI Pam Clark is a 28 year old black female, SO, G2P0020, in to discuss birth control, and needs a pap.  PCP is Pam Clark  Review of Systems Denies MI,stroke, DVT, breast cancer or migraine with aura  Has periods regularly Reviewed past medical,surgical, social and family history. Reviewed medications and allergies.     Objective:   Physical Exam BP 122/79 (BP Location: Left Arm, Patient Position: Sitting, Cuff Size: Normal)   Pulse 78   Ht 5' 6 (1.676 m)   Wt 141 lb 8 oz (64.2 kg)   LMP 10/22/2023 (Exact Date)   Breastfeeding No   BMI 22.84 kg/m  UPT is negative Skin warm and dry.  Lungs: clear to ausculation bilaterally. Cardiovascular: regular rate and rhythm.    Pelvic: external genitalia is normal in appearance, has 1 cm epidermal cyst right lower labia, vagina: pink,urethra has no lesions or masses noted, cervix:smooth, pap with HR HPV genotyping and GC/CHL,trich performed, uterus: normal size, shape and contour, non tender, no masses felt, adnexa: no masses or tenderness noted. Bladder is non tender and no masses felt.  Upstream - 11/01/23 9096       Pregnancy Intention Screening   Does the patient want to become pregnant in the next year? Unsure    Does the patient's partner want to become pregnant in the next year? Unsure    Would the patient like to discuss contraceptive options today? Yes      Contraception Wrap Up   Current Method Abstinence    End Method Oral Contraceptive    Contraception Counseling Provided Yes    How was the end contraceptive method provided? Prescription         Examination chaperoned by Clarita Salt LPN  Assessment:     1. Negative pregnancy test - POCT urine pregnancy  2. Routine Papanicolaou smear Pap sent Pap in 3 years if negative  - Cytology - PAP( [place])  3. Encounter for initial prescription of contraceptive pills  (Primary) Will rx lo Loestrin , can start today, use condoms for 1 pack, 1 sample pack given  Meds ordered this encounter  Medications   Norethindrone-Ethinyl Estradiol -Fe Biphas (LO LOESTRIN FE ) 1 MG-10 MCG / 10 MCG tablet    Sig: Take 1 tablet by mouth daily. Take 1 daily by mouth    Dispense:  84 tablet    Refill:  3    BIN [id number], PCN [id number], GRP [id number],PI [id number]    Supervising Provider:   JAYNE MINDER H [[id number]]   Follow up in 3 months for ROS or sooner if needed   4. Epidermal cyst of vulva Panties rub this at times, try boxers and will get appt with D rOzan in about 4 weeks for excision     Plan:     Follow up in 3 months for ROS

## 2023-11-03 ENCOUNTER — Ambulatory Visit: Payer: Self-pay | Admitting: Adult Health

## 2023-11-03 LAB — CYTOLOGY - PAP
Chlamydia: NEGATIVE
Comment: NEGATIVE
Comment: NEGATIVE
Comment: NEGATIVE
Comment: NORMAL
Diagnosis: NEGATIVE
High risk HPV: NEGATIVE
Neisseria Gonorrhea: NEGATIVE
Trichomonas: NEGATIVE

## 2023-11-03 MED ORDER — METRONIDAZOLE 500 MG PO TABS: 500.0000 mg | ORAL_TABLET | Freq: Two times a day (BID) | ORAL | 0 refills | Status: DC

## 2023-11-04 ENCOUNTER — Other Ambulatory Visit: Payer: Self-pay | Admitting: Adult Health

## 2023-11-04 MED ORDER — SULFAMETHOXAZOLE-TRIMETHOPRIM 800-160 MG PO TABS: 1.0000 | ORAL_TABLET | Freq: Two times a day (BID) | ORAL | 0 refills | Status: DC

## 2023-11-04 NOTE — Progress Notes (Signed)
 Rx septra ds

## 2023-11-16 ENCOUNTER — Ambulatory Visit: Payer: Self-pay | Admitting: Obstetrics & Gynecology

## 2023-12-06 ENCOUNTER — Ambulatory Visit: Payer: Self-pay | Admitting: Obstetrics & Gynecology

## 2023-12-09 ENCOUNTER — Encounter: Payer: Self-pay | Admitting: Obstetrics & Gynecology

## 2023-12-09 ENCOUNTER — Ambulatory Visit (INDEPENDENT_AMBULATORY_CARE_PROVIDER_SITE_OTHER): Payer: Self-pay | Admitting: Obstetrics & Gynecology

## 2023-12-09 VITALS — BP 109/71 | HR 88 | Ht 66.0 in | Wt 142.8 lb

## 2023-12-09 DIAGNOSIS — Z30017 Encounter for initial prescription of implantable subdermal contraceptive: Secondary | ICD-10-CM | POA: Diagnosis not present

## 2023-12-09 DIAGNOSIS — Z3009 Encounter for other general counseling and advice on contraception: Secondary | ICD-10-CM

## 2023-12-09 MED ORDER — ETONOGESTREL 68 MG ~~LOC~~ IMPL
68.0000 mg | DRUG_IMPLANT | Freq: Once | SUBCUTANEOUS | Status: AC
Start: 2023-12-09 — End: 2023-12-09
  Administered 2023-12-09: 68 mg via SUBCUTANEOUS

## 2023-12-09 NOTE — Addendum Note (Signed)
 Addended by: Yudit Modesitt I on: 12/09/2023 04:10 PM   Modules accepted: Orders
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
## 2023-12-13 ENCOUNTER — Ambulatory Visit (INDEPENDENT_AMBULATORY_CARE_PROVIDER_SITE_OTHER)

## 2023-12-13 ENCOUNTER — Telehealth: Payer: Self-pay | Admitting: Family Medicine

## 2023-12-13 ENCOUNTER — Ambulatory Visit: Admission: EM | Admit: 2023-12-13 | Discharge: 2023-12-13 | Disposition: A | Discharge status: Home / Self Care

## 2023-12-13 ENCOUNTER — Telehealth: Payer: Self-pay

## 2023-12-13 ENCOUNTER — Other Ambulatory Visit: Payer: Self-pay

## 2023-12-13 DIAGNOSIS — R051 Acute cough: Secondary | ICD-10-CM | POA: Diagnosis not present

## 2023-12-13 DIAGNOSIS — J208 Acute bronchitis due to other specified organisms: Secondary | ICD-10-CM | POA: Diagnosis not present

## 2023-12-13 LAB — POC COVID19/FLU A&B COMBO
Covid Antigen, POC: NEGATIVE
Influenza A Antigen, POC: NEGATIVE
Influenza B Antigen, POC: NEGATIVE

## 2023-12-13 MED ORDER — PROMETHAZINE-DM 6.25-15 MG/5ML PO SYRP: 5.0000 mL | ORAL_SOLUTION | Freq: Four times a day (QID) | ORAL | 0 refills | Status: DC | PRN

## 2023-12-13 MED ORDER — PREDNISONE 20 MG PO TABS: 40.0000 mg | ORAL_TABLET | Freq: Every day | ORAL | 0 refills | Status: DC

## 2023-12-13 MED ORDER — ALBUTEROL SULFATE HFA 108 (90 BASE) MCG/ACT IN AERS: 2.0000 | INHALATION_SPRAY | RESPIRATORY_TRACT | 0 refills | Status: DC | PRN

## 2023-12-13 NOTE — Telephone Encounter (Signed)
 Medications called in as discussed with patient for recent visit for viral bronchitis

## 2023-12-13 NOTE — Telephone Encounter (Signed)
 Called patient  x 2 regarding mot recent x-ray. Voice mail is full.

## 2023-12-13 NOTE — ED Triage Notes (Addendum)
 Pt reports being seen in UC for headache, congestion, R nostril feeling blocked, generalized body aches, cough, and L sided CP near rib. Pt denies fever, n/v. Pt denies taking otc medication.    Pt reports wanting to be checked for flu/covid.

## 2023-12-13 NOTE — Telephone Encounter (Signed)
 Medication sent to [facility] instead of [facility].

## 2023-12-13 NOTE — Discharge Instructions (Signed)
 Will give you a call as soon as your x-ray comes back and come up with a plan from there

## 2023-12-14 ENCOUNTER — Ambulatory Visit
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
## 2023-12-17 ENCOUNTER — Ambulatory Visit: Payer: Self-pay | Admitting: Family Medicine

## 2024-01-03 ENCOUNTER — Telehealth

## 2024-01-03 ENCOUNTER — Telehealth: Admitting: Physician Assistant

## 2024-01-03 DIAGNOSIS — N760 Acute vaginitis: Secondary | ICD-10-CM

## 2024-01-03 MED ORDER — METRONIDAZOLE 500 MG PO TABS
500.0000 mg | ORAL_TABLET | Freq: Two times a day (BID) | ORAL | 0 refills | Status: AC
Start: 2024-01-03 — End: 2024-01-10

## 2024-01-03 NOTE — Patient Instructions (Addendum)
  Pam Clark, thank you for joining Sae Handrich, PA-C for today's virtual visit.  While this provider is not your primary care provider (PCP), if your PCP is located in our provider database this encounter information will be shared with them immediately following your visit.   A [facility] MyChart account gives you access to today's visit and all your visits, tests, and labs performed at [facility]  click here if you don't have a [facility] MyChart account or go to [email or web address]  Consent: (Patient) Pam Clark provided verbal consent for this virtual visit at the beginning of the encounter.  Current Medications:  Current Outpatient Medications:    metroNIDAZOLE  (FLAGYL ) 500 MG tablet, Take 1 tablet (500 mg total) by mouth 2 (two) times daily for 7 days., Disp: 14 tablet, Rfl: 0   etonogestrel  (NEXPLANON ) 68 MG IMPL implant, 1 each by Subdermal route once., Disp: , Rfl:    predniSONE  (DELTASONE ) 20 MG tablet, Take 2 tablets (40 mg total) by mouth daily with breakfast., Disp: 10 tablet, Rfl: 0   Medications ordered in this encounter:  Meds ordered this encounter  Medications   metroNIDAZOLE  (FLAGYL ) 500 MG tablet    Sig: Take 1 tablet (500 mg total) by mouth 2 (two) times daily for 7 days.    Dispense:  14 tablet    Refill:  0    Supervising Provider:   BLAISE ALEENE KIDD [[id number]]     *If you need refills on other medications prior to your next appointment, please contact your pharmacy*  Follow-Up: Call back or seek an in-person evaluation if the symptoms worsen or if the condition fails to improve as anticipated.  [facility] [phone]      If you have been instructed to have an in-person evaluation today at a local Urgent Care facility, please use the link below. It will take you to a list of all of our available [facility], including address, phone number and hours of operation. Please do not delay  care.  [facility]  If you or a family member do not have a primary care provider, use the link below to schedule a visit and establish care. When you choose a [facility] primary care physician or advanced practice provider, you gain a long-term partner in health. Find a Primary Care Provider  Learn more about [facility]'s in-office and virtual care options:  - Get Care Now

## 2024-01-03 NOTE — Progress Notes (Signed)
 Virtual Visit Consent   Pam Clark, you are scheduled for a virtual visit with a [facility] provider today. Just as with appointments in the office, your consent must be obtained to participate. Your consent will be active for this visit and any virtual visit you may have with one of our providers in the next 365 days. If you have a MyChart account, a copy of this consent can be sent to you electronically.  As this is a virtual visit, video technology does not allow for your provider to perform a traditional examination. This may limit your provider's ability to fully assess your condition. If your provider identifies any concerns that need to be evaluated in person or the need to arrange testing (such as labs, EKG, etc.), we will make arrangements to do so. Although advances in technology are sophisticated, we cannot ensure that it will always work on either your end or our end. If the connection with a video visit is poor, the visit may have to be switched to a telephone visit. With either a video or telephone visit, we are not always able to ensure that we have a secure connection.  By engaging in this virtual visit, you consent to the provision of healthcare and authorize for your insurance to be billed (if applicable) for the services provided during this visit. Depending on your insurance coverage, you may receive a charge related to this service.  I need to obtain your verbal consent now. Are you willing to proceed with your visit today? Margorie Renner has provided verbal consent on 01/03/2024 for a virtual visit (video or telephone). [name], PA-C  Date: 01/03/2024 11:41 AM   Virtual Visit via Video Note   I, Pam Clark, connected with  Pam Clark  ([id number], Aug 28, 1995) on 01/03/24 at 11:30 AM EDT by a video-enabled telemedicine application and verified that I am speaking with the correct person using two identifiers.  Location: Patient: Virtual Visit  Location Patient: Other: work Provider: Pharmacist, community: Home Office   I discussed the limitations of evaluation and management by telemedicine and the availability of in person appointments. The patient expressed understanding and agreed to proceed.    History of Present Illness: Pam Clark is a 28 y.o. who identifies as a female who was assigned female at birth, and is being seen today for vaginal discharge.  HPI: Presents for evaluation of vaginal discharge for the last 4 days.  Reports white, milky discharge with an odor.  She denies any urinary symptoms, reports no concern for STDs today.]LMP 12/04/23, currently on birth control.  Reports no chance of pregnancy.  She denies any fevers, chills, nausea, vomiting, abdominal pain, back pain.     Problems:  Patient Active Problem List   Diagnosis Date Noted   Encounter for initial prescription of contraceptive pills 11/01/2023   Routine Papanicolaou smear 11/01/2023   Negative pregnancy test 11/01/2023   Epidermal cyst of vulva 11/01/2023   Chlamydia 04/21/2021   Nexplanon  removal 04/16/2021   Screen for STD (sexually transmitted disease) 04/16/2021   S/P lobectomy of lung 01/29/2020   Bronchiectasis without complication (HCC) 10/09/2019    Allergies:  Allergies  Allergen Reactions   Doxycycline  Nausea And Vomiting   Medications:  Current Outpatient Medications:    metroNIDAZOLE  (FLAGYL ) 500 MG tablet, Take 1 tablet (500 mg total) by mouth 2 (two) times daily for 7 days., Disp: 14 tablet, Rfl: 0   etonogestrel  (NEXPLANON ) 68 MG IMPL implant, 1  each by Subdermal route once., Disp: , Rfl:    predniSONE  (DELTASONE ) 20 MG tablet, Take 2 tablets (40 mg total) by mouth daily with breakfast., Disp: 10 tablet, Rfl: 0  Observations/Objective: Patient is well-developed, well-nourished in no acute distress.  Head is normocephalic, atraumatic.  No labored breathing.  Speech is clear and coherent with logical  content.  Patient is alert and oriented at baseline.    Assessment and Plan: 1. Acute vaginitis (Primary) - metroNIDAZOLE  (FLAGYL ) 500 MG tablet; Take 1 tablet (500 mg total) by mouth 2 (two) times daily for 7 days.  Dispense: 14 tablet; Refill: 0  Suspected bacterial vaginosis, will treat with Flagyl . Please seek an in-person evaluation if the symptoms worsen or if the condition fails to improve as anticipated.  PCP follow-up in 5-7 days   Follow Up Instructions: I discussed the assessment and treatment plan with the patient. The patient was provided an opportunity to ask questions and all were answered. The patient agreed with the plan and demonstrated an understanding of the instructions.  A copy of instructions were sent to the patient via MyChart unless otherwise noted below.    The patient was advised to call back or seek an in-person evaluation if the symptoms worsen or if the condition fails to improve as anticipated.    [name], PA-C

## 2024-01-28 ENCOUNTER — Telehealth: Admitting: Physician Assistant

## 2024-01-28 ENCOUNTER — Telehealth

## 2024-01-28 DIAGNOSIS — R3989 Other symptoms and signs involving the genitourinary system: Secondary | ICD-10-CM | POA: Diagnosis not present

## 2024-01-28 MED ORDER — CEPHALEXIN 500 MG PO CAPS: 500.0000 mg | ORAL_CAPSULE | Freq: Two times a day (BID) | ORAL | 0 refills | Status: DC

## 2024-01-28 NOTE — Progress Notes (Signed)
 Virtual Visit Consent   Carriann Hesse, you are scheduled for a virtual visit with a  provider today. Just as with appointments in the office, your consent must be obtained to participate. Your consent will be active for this visit and any virtual visit you may have with one of our providers in the next 365 days. If you have a MyChart account, a copy of this consent can be sent to you electronically.  As this is a virtual visit, video technology does not allow for your provider to perform a traditional examination. This may limit your provider's ability to fully assess your condition. If your provider identifies any concerns that need to be evaluated in person or the need to arrange testing (such as labs, EKG, etc.), we will make arrangements to do so. Although advances in technology are sophisticated, we cannot ensure that it will always work on either your end or our end. If the connection with a video visit is poor, the visit may have to be switched to a telephone visit. With either a video or telephone visit, we are not always able to ensure that we have a secure connection.  By engaging in this virtual visit, you consent to the provision of healthcare and authorize for your insurance to be billed (if applicable) for the services provided during this visit. Depending on your insurance coverage, you may receive a charge related to this service.  I need to obtain your verbal consent now. Are you willing to proceed with your visit today? [name] has provided verbal consent on 01/28/2024 for a virtual visit (video or telephone). Teena Shuck, [place]  Date: 01/28/2024 11:03 AM   Virtual Visit via Video Note   I, Teena Shuck, connected with  Pam Clark  ([id number], Jun 04, 1995) on 01/28/24 at 11:00 AM EDT by a video-enabled telemedicine application and verified that I am speaking with the correct person using two identifiers.  Location: Patient: Virtual  Visit Location Patient: Home Provider: Virtual Visit Location Provider: Home Office   I discussed the limitations of evaluation and management by telemedicine and the availability of in person appointments. The patient expressed understanding and agreed to proceed.    History of Present Illness: Pam Clark is a 28 y.o. who identifies as a female who was assigned female at birth, and is being seen today for UTI Symptoms.  HPI: Urinary Tract Infection  This is a new problem. The current episode started in the past 7 days. The problem occurs every urination. The pain is at a severity of 0/10. The patient is experiencing no pain. There has been no fever. She is Not sexually active. There is No history of pyelonephritis. Associated symptoms include hesitancy and urgency. Pertinent negatives include no chills, discharge, flank pain, frequency, hematuria, nausea, possible pregnancy or sweats. She has tried nothing for the symptoms.    Problems:  Patient Active Problem List   Diagnosis Date Noted   Encounter for initial prescription of contraceptive pills 11/01/2023   Routine Papanicolaou smear 11/01/2023   Negative pregnancy test 11/01/2023   Epidermal cyst of vulva 11/01/2023   Chlamydia 04/21/2021   Nexplanon  removal 04/16/2021   Screen for STD (sexually transmitted disease) 04/16/2021   S/P lobectomy of lung 01/29/2020   Bronchiectasis without complication (HCC) 10/09/2019    Allergies:  Allergies  Allergen Reactions   Doxycycline  Nausea And Vomiting   Medications:  Current Outpatient Medications:    etonogestrel  (NEXPLANON ) 68 MG IMPL implant, 1 each by  Subdermal route once., Disp: , Rfl:    predniSONE  (DELTASONE ) 20 MG tablet, Take 2 tablets (40 mg total) by mouth daily with breakfast., Disp: 10 tablet, Rfl: 0  Observations/Objective: Patient is well-developed, well-nourished in no acute distress.  Resting comfortably  at home.  Head is normocephalic, atraumatic.  No  labored breathing.  Speech is clear and coherent with logical content.  Patient is alert and oriented at baseline.    Assessment and Plan: 1. Suspected UTI (Primary)  Patient presenting with vaginal irritation most likely UTI.   I also considered PID, pregnancy, ectopic pregnancy, endometriosis, BV,  tubovarian abscess, appendicitis, yeast vaginitis,  and pyelonephritis, but this appears less likely considering the data gathered thus far.  Medication prescribed. I have instructed the patient to present to the nearest ER at any time if there are any new or worsening symptoms.  The patient expressed understanding of and agreement with this plan.  Opportunity was given for questions prior to discharge and all stated questions were answered to the patient's satisfaction.    Follow Up Instructions: I discussed the assessment and treatment plan with the patient. The patient was provided an opportunity to ask questions and all were answered. The patient agreed with the plan and demonstrated an understanding of the instructions.  A copy of instructions were sent to the patient via MyChart unless otherwise noted below.    The patient was advised to call back or seek an in-person evaluation if the symptoms worsen or if the condition fails to improve as anticipated.    Teena Shuck, PA-C

## 2024-01-28 NOTE — Patient Instructions (Signed)
  Pam Clark, thank you for joining Teena Shuck, PA-C for today's virtual visit.  While this provider is not your primary care provider (PCP), if your PCP is located in our provider database this encounter information will be shared with them immediately following your visit.   A [facility] MyChart account gives you access to today's visit and all your visits, tests, and labs performed at [facility]  click here if you don't have a [facility] MyChart account or go to [email or web address]  Consent: (Patient) Pam Clark provided verbal consent for this virtual visit at the beginning of the encounter.  Current Medications:  Current Outpatient Medications:    etonogestrel  (NEXPLANON ) 68 MG IMPL implant, 1 each by Subdermal route once., Disp: , Rfl:    predniSONE  (DELTASONE ) 20 MG tablet, Take 2 tablets (40 mg total) by mouth daily with breakfast., Disp: 10 tablet, Rfl: 0   Medications ordered in this encounter:  No orders of the defined types were placed in this encounter.    *If you need refills on other medications prior to your next appointment, please contact your pharmacy*  Follow-Up: Call back or seek an in-person evaluation if the symptoms worsen or if the condition fails to improve as anticipated.  [facility] [phone]  Other Instructions Please report to the nearest Emergency room with any worsening symptoms. Follow up with primary care provider (PCP) in 2 -3 days.    If you have been instructed to have an in-person evaluation today at a local Urgent Care facility, please use the link below. It will take you to a list of all of our available [facility], including address, phone number and hours of operation. Please do not delay care.  [facility]  If you or a family member do not have a primary care provider, use the link below to schedule a visit and establish care. When you choose a [facility] primary care physician or advanced practice provider, you gain a long-term partner in health. Find a Primary Care Provider  Learn more about [facility] in-office and virtual care options:  - Get Care Now

## 2024-02-01 ENCOUNTER — Other Ambulatory Visit (HOSPITAL_COMMUNITY)
Admission: RE | Admit: 2024-02-01 | Discharge: 2024-02-01 | Disposition: A | Discharge status: Home / Self Care | Source: Ambulatory Visit | Attending: Adult Health | Admitting: Adult Health

## 2024-02-01 ENCOUNTER — Encounter: Payer: Self-pay | Admitting: Adult Health

## 2024-02-01 ENCOUNTER — Ambulatory Visit (INDEPENDENT_AMBULATORY_CARE_PROVIDER_SITE_OTHER): Payer: Self-pay | Admitting: Adult Health

## 2024-02-01 VITALS — BP 113/76 | HR 63 | Ht 66.0 in | Wt 145.0 lb

## 2024-02-01 DIAGNOSIS — Z113 Encounter for screening for infections with a predominantly sexual mode of transmission: Secondary | ICD-10-CM

## 2024-02-01 DIAGNOSIS — N898 Other specified noninflammatory disorders of vagina: Secondary | ICD-10-CM | POA: Diagnosis present

## 2024-02-01 DIAGNOSIS — N3001 Acute cystitis with hematuria: Secondary | ICD-10-CM

## 2024-02-01 DIAGNOSIS — R3915 Urgency of urination: Secondary | ICD-10-CM

## 2024-02-01 LAB — POCT URINALYSIS DIPSTICK
Glucose, UA: NEGATIVE
Ketones, UA: NEGATIVE
Nitrite, UA: POSITIVE
Protein, UA: POSITIVE — AB

## 2024-02-01 MED ORDER — SULFAMETHOXAZOLE-TRIMETHOPRIM 800-160 MG PO TABS: 1.0000 | ORAL_TABLET | Freq: Two times a day (BID) | ORAL | 0 refills | Status: DC

## 2024-02-01 NOTE — Progress Notes (Signed)
  Subjective:     Patient ID: Pam Clark, female   DOB: 1995-11-25, 28 y.o.   MRN: [id number]  HPI Pam Clark is a 28 year old black female,single, G2P0020, in complaining of urinary urgency and some urge incontinence. She had a video visit 01/28/24 and was prescribed flagyl , but has not started yet.     Component Value Date/Time   DIAGPAP  11/01/2023 0910    - Negative for intraepithelial lesion or malignancy (NILM)   DIAGPAP  05/31/2020 1046    - Negative for intraepithelial lesion or malignancy (NILM)   HPVHIGH Negative 11/01/2023 0910   ADEQPAP  11/01/2023 0910    Satisfactory for evaluation; transformation zone component PRESENT.   ADEQPAP  05/31/2020 1046    Satisfactory for evaluation; transformation zone component PRESENT.     Review of Systems +urinary urgency  with some urge incontinence. +vaginal discharge  Reviewed past medical,surgical, social and family history. Reviewed medications and allergies.     Objective:   Physical Exam BP 113/76 (BP Location: Right Arm, Patient Position: Sitting, Cuff Size: Normal)   Pulse 63   Ht 5' 6 (1.676 m)   Wt 145 lb (65.8 kg)   LMP 01/24/2024 (Exact Date)   BMI 23.40 kg/m  urine dipstick trace blood, 1+ protein, + nitrates and trace leuks. She obtained a self swab. Skin warm and dry. Lungs: clear to ausculation bilaterally. Cardiovascular: regular rate and rhythm.    Fall risk is low  [facility] - 02/01/24 0904       Pregnancy Intention Screening   Does the patient want to become pregnant in the next year? No    Does the patient's partner want to become pregnant in the next year? No    Would the patient like to discuss contraceptive options today? No      Contraception Wrap Up   Current Method Hormonal Implant    End Method Hormonal Implant    Contraception Counseling Provided Yes          Assessment:     1. Vaginal discharge CV swab sent Will message when results back, already has rx for flagyl  if +BV -  Cervicovaginal ancillary only( [facility])  2. Screening examination for STD (sexually transmitted disease) CV swab sent for GC/CHL,trich,BV and yeast  - Cervicovaginal ancillary only( [facility])  3. Urinary urgency +urgency with some urge incontinence Urine sent for UA C&S  - POCT Urinalysis Dipstick - Urinalysis, Routine w reflex microscopic - Urine Culture  4. Acute cystitis with hematuria (Primary) +nitrates and blood, protein and leuks in urine Will rx septra  ds 1 bid x 7 days and push fluids, like water, cranberry or apple juice Meds ordered this encounter  Medications   sulfamethoxazole -trimethoprim  (BACTRIM  DS) 800-160 MG tablet    Sig: Take 1 tablet by mouth 2 (two) times daily. Take 1 bid    Dispense:  14 tablet    Refill:  0    Supervising Provider:   JAYNE VONN DEL [[id number]]       Plan:     Follow up prn

## 2024-02-02 ENCOUNTER — Ambulatory Visit: Payer: Self-pay | Admitting: Adult Health

## 2024-02-02 DIAGNOSIS — R3915 Urgency of urination: Secondary | ICD-10-CM

## 2024-02-02 LAB — URINALYSIS, ROUTINE W REFLEX MICROSCOPIC
Bilirubin, UA: NEGATIVE
Glucose, UA: NEGATIVE
Nitrite, UA: POSITIVE — AB
Specific Gravity, UA: 1.027 (ref 1.005–1.030)
Urobilinogen, Ur: 0.2 mg/dL (ref 0.2–1.0)
pH, UA: 5.5 (ref 5.0–7.5)

## 2024-02-02 LAB — CERVICOVAGINAL ANCILLARY ONLY
Bacterial Vaginitis (gardnerella): NEGATIVE
Candida Glabrata: NEGATIVE
Candida Vaginitis: NEGATIVE
Chlamydia: NEGATIVE
Comment: NEGATIVE
Comment: NEGATIVE
Comment: NEGATIVE
Comment: NEGATIVE
Comment: NEGATIVE
Comment: NORMAL
Neisseria Gonorrhea: NEGATIVE
Trichomonas: NEGATIVE

## 2024-02-02 LAB — MICROSCOPIC EXAMINATION
Casts: NONE SEEN /LPF
Epithelial Cells (non renal): >10 /HPF — AB (ref 0–10)
RBC, Urine: NONE SEEN /HPF (ref 0–2)
WBC, UA: >30 /HPF — AB (ref 0–5)

## 2024-02-06 LAB — URINE CULTURE

## 2024-02-07 MED ORDER — NITROFURANTOIN MONOHYD MACRO 100 MG PO CAPS: 100.0000 mg | ORAL_CAPSULE | Freq: Two times a day (BID) | ORAL | 0 refills | Status: DC

## 2024-02-09 MED ORDER — NITROFURANTOIN MONOHYD MACRO 100 MG PO CAPS: 100.0000 mg | ORAL_CAPSULE | Freq: Two times a day (BID) | ORAL | 0 refills | Status: DC

## 2024-03-06 ENCOUNTER — Ambulatory Visit: Admitting: Adult Health

## 2024-03-06 ENCOUNTER — Encounter: Payer: Self-pay | Admitting: Adult Health

## 2024-03-06 VITALS — BP 112/74 | HR 76 | Ht 66.0 in | Wt 145.6 lb

## 2024-03-06 DIAGNOSIS — N907 Vulvar cyst: Secondary | ICD-10-CM

## 2024-03-06 MED ORDER — IBUPROFEN 800 MG PO TABS: 800.0000 mg | ORAL_TABLET | Freq: Three times a day (TID) | ORAL | 1 refills | Status: DC | PRN

## 2024-03-06 MED ORDER — SULFAMETHOXAZOLE-TRIMETHOPRIM 800-160 MG PO TABS: 1.0000 | ORAL_TABLET | Freq: Two times a day (BID) | ORAL | 0 refills | Status: DC

## 2024-03-06 NOTE — Progress Notes (Signed)
  Subjective:     Patient ID: Pam Clark, female   DOB: Aug 28, 1995, 28 y.o.   MRN: [id number]  HPI Pam Clark is a 28 year old black female,single, G2P0020, in complaining of vulva cyst getting bigger and hurts.     Component Value Date/Time   DIAGPAP  11/01/2023 0910    - Negative for intraepithelial lesion or malignancy (NILM)   DIAGPAP  05/31/2020 1046    - Negative for intraepithelial lesion or malignancy (NILM)   HPVHIGH Negative 11/01/2023 0910   ADEQPAP  11/01/2023 0910    Satisfactory for evaluation; transformation zone component PRESENT.   ADEQPAP  05/31/2020 1046    Satisfactory for evaluation; transformation zone component PRESENT.     Review of Systems Has vulva cyst that is getting bigger and hurts Reviewed past medical,surgical, social and family history. Reviewed medications and allergies.     Objective:   Physical Exam BP 112/74 (BP Location: Right Arm, Patient Position: Sitting, Cuff Size: Normal)   Pulse 76   Ht 5' 6 (1.676 m)   Wt 145 lb 9.6 oz (66 kg)   LMP 02/28/2024 (Approximate)   BMI 23.50 kg/m     Skin warm and dry.Pelvic: external genitalia is normal in appearance has 1.5-2 cm epidermal cyst right labia  vagina: +scant blood,urethra has no lesions or masses noted, cervix:smooth, uterus: normal size, shape and contour, non tender, no masses felt, adnexa: no masses or tenderness noted. Bladder is non tender and no masses felt.  [facility] - 03/06/24 1144       Pregnancy Intention Screening   Does the patient want to become pregnant in the next year? No    Does the patient's partner want to become pregnant in the next year? No    Would the patient like to discuss contraceptive options today? No      Contraception Wrap Up   Current Method Hormonal Implant    End Method Hormonal Implant    Contraception Counseling Provided Yes         Examination chaperoned by Clarita Salt LPN  Assessment:     1. Epidermal cyst of vulva (Primary) Cyst on  right labia, getting bigger and painful Will rx septra  ds 1 bid x 14 days Will rx motrin  800 mg for pain prn  Use warm compress or sit in tub with epsom salt Meds ordered this encounter  Medications   ibuprofen  (ADVIL ) 800 MG tablet    Sig: Take 1 tablet (800 mg total) by mouth every 8 (eight) hours as needed.    Dispense:  60 tablet    Refill:  1    Supervising Provider:   JAYNE MINDER H [[id number]]   sulfamethoxazole -trimethoprim  (BACTRIM  DS) 800-160 MG tablet    Sig: Take 1 tablet by mouth 2 (two) times daily. Take 1 bid    Dispense:  28 tablet    Refill:  0    Supervising Provider:   JAYNE MINDER DEL [[id number]]       Plan:     Follow up 03/21/24 with Dr Jayne, may need removal

## 2024-03-21 ENCOUNTER — Other Ambulatory Visit (HOSPITAL_COMMUNITY)
Admission: RE | Admit: 2024-03-21 | Discharge: 2024-03-21 | Disposition: A | Discharge status: Home / Self Care | Source: Ambulatory Visit | Attending: Obstetrics & Gynecology | Admitting: Obstetrics & Gynecology

## 2024-03-21 ENCOUNTER — Ambulatory Visit: Admitting: Obstetrics & Gynecology

## 2024-03-21 ENCOUNTER — Encounter: Payer: Self-pay | Admitting: Obstetrics & Gynecology

## 2024-03-21 VITALS — BP 111/73 | HR 94 | Ht 66.0 in | Wt 146.0 lb

## 2024-03-21 DIAGNOSIS — N750 Cyst of Bartholin's gland: Secondary | ICD-10-CM | POA: Diagnosis not present

## 2024-03-21 DIAGNOSIS — L720 Epidermal cyst: Secondary | ICD-10-CM

## 2024-03-21 NOTE — Progress Notes (Signed)
 Preoperative diagnosis: Bilobed epidermal inclusion cyst of the vulva, just to the right of midline at the perineum  Postoperative diagnosis: Same  Procedure: Excision of epidermal inclusion cyst with 2 layered vulvar closure  Surgeon: Vonn VEAR Inch, MD  Anesthesia: 10 cc of 0.5% Marcaine  plain  Findings: Bilobed epidermal inclusion cyst not currently infected Tissue induration consistent with chronic inflammatory changes and infection  Description of operation: Area was prepped with Betadine 10 cc half percent bupivacaine  plain was injected Waited 10 minutes 11 blade was used and a total excision was performed getting underneath the bilobed epidermal inclusion cyst and removing them in toto, I did not dissected out the epidermal inclusion cyst removed them in toto  Additional inflammatory tissue was present which was also excised  Left quite a significant defect and a multilayer closure was performed The first layer obliterated dead space In the second layer was subcuticular There was good hemostasis There was good return of anatomy 3-0 chromic was used Approximately 10 sutures were placed  Patient will practice good local care with sitz bath's ice packs if needed As needed over-the-counter medications ibuprofen  Tylenol   No specific follow-up is needed unless the patient has any concerns Avoid vaginal intercourse and vaginal products for 3 to 4 weeks  Vonn VEAR Inch, MD 03/21/2024 12:13 PM

## 2024-03-21 NOTE — Addendum Note (Signed)
 Addended by: BERNARDO ALAN CROME on: 03/21/2024 01:38 PM   Modules accepted: Orders

## 2024-03-24 LAB — SURGICAL PATHOLOGY

## 2024-04-02 ENCOUNTER — Telehealth

## 2024-04-02 DIAGNOSIS — L089 Local infection of the skin and subcutaneous tissue, unspecified: Secondary | ICD-10-CM

## 2024-04-02 MED ORDER — SULFAMETHOXAZOLE-TRIMETHOPRIM 800-160 MG PO TABS: 1.0000 | ORAL_TABLET | Freq: Two times a day (BID) | ORAL | 0 refills | Status: AC

## 2024-04-02 NOTE — Progress Notes (Signed)
 " Virtual Visit Consent   Pam Clark, you are scheduled for a virtual visit with a [facility] provider today. Just as with appointments in the office, your consent must be obtained to participate. Your consent will be active for this visit and any virtual visit you may have with one of our providers in the next 365 days. If you have a MyChart account, a copy of this consent can be sent to you electronically.  As this is a virtual visit, video technology does not allow for your provider to perform a traditional examination. This may limit your provider's ability to fully assess your condition. If your provider identifies any concerns that need to be evaluated in person or the need to arrange testing (such as labs, EKG, etc.), we will make arrangements to do so. Although advances in technology are sophisticated, we cannot ensure that it will always work on either your end or our end. If the connection with a video visit is poor, the visit may have to be switched to a telephone visit. With either a video or telephone visit, we are not always able to ensure that we have a secure connection.  By engaging in this virtual visit, you consent to the provision of healthcare and authorize for your insurance to be billed (if applicable) for the services provided during this visit. Depending on your insurance coverage, you may receive a charge related to this service.  I need to obtain your verbal consent now. Are you willing to proceed with your visit today? Pam Clark has provided verbal consent on 04/02/2024 for a virtual visit (video or telephone). Haze LELON Servant, NP  Date: 04/02/2024 2:15 PM   Virtual Visit via Video Note   I, Haze LELON Servant, connected with  Pam Clark  ([id number], [birth year]) on 04/02/2024 at  2:00 PM EST by a video-enabled telemedicine application and verified that I am speaking with the correct person using two identifiers.  Location: Patient: Virtual Visit  Location Patient: Home Provider: Virtual Visit Location Provider: Home Office   I discussed the limitations of evaluation and management by telemedicine and the availability of in person appointments. The patient expressed understanding and agreed to proceed.    History of Present Illness: Pam Clark is a 29 y.o. who identifies as a female who was assigned female at birth, and is being seen today for skin infection   Ms. Desouza has excision of epidermal inclusion cyst with 2 layered vulvar closure on 03-21-2024. She has been experiencing difficulty sitting for prolonged periods of time and feels the area where her perineal stitches are located is warm to touch and swollen or increased in size. She also notes a change in the color the drainage from the area.   Problems:  Patient Active Problem List   Diagnosis Date Noted   Acute cystitis with hematuria 02/01/2024   Screening examination for STD (sexually transmitted disease) 02/01/2024   Urinary urgency 02/01/2024   Vaginal discharge 02/01/2024   Encounter for initial prescription of contraceptive pills 11/01/2023   Routine Papanicolaou smear 11/01/2023   Negative pregnancy test 11/01/2023   Epidermal cyst of vulva 11/01/2023   Chlamydia 04/21/2021   Nexplanon  removal 04/16/2021   Screen for STD (sexually transmitted disease) 04/16/2021   S/P lobectomy of lung 01/29/2020   Bronchiectasis without complication (HCC) 10/09/2019    Allergies: Allergies[1] Medications: Current Medications[2]  Observations/Objective: Patient is well-developed, well-nourished in no acute distress.  Resting comfortably at home.  Head is  normocephalic, atraumatic.  No labored breathing.  Speech is clear and coherent with logical content.  Patient is alert and oriented at baseline.    Assessment and Plan: 1. Skin infection (Primary) - sulfamethoxazole -trimethoprim  (BACTRIM  DS) 800-160 MG tablet; Take 1 tablet by mouth 2 (two) times daily for  7 days.  Dispense: 14 tablet; Refill: 0  Sent antibiotics for presumed post op surgical infection. She was instructed to follow up with surgeon tomorrow for further recommendations  Follow Up Instructions: I discussed the assessment and treatment plan with the patient. The patient was provided an opportunity to ask questions and all were answered. The patient agreed with the plan and demonstrated an understanding of the instructions.  A copy of instructions were sent to the patient via MyChart unless otherwise noted below.    The patient was advised to call back or seek an in-person evaluation if the symptoms worsen or if the condition fails to improve as anticipated.    Haze LELON Servant, NP      [1]  Allergies Allergen Reactions   Doxycycline  Nausea And Vomiting  [2]  Current Outpatient Medications:    sulfamethoxazole -trimethoprim  (BACTRIM  DS) 800-160 MG tablet, Take 1 tablet by mouth 2 (two) times daily for 7 days., Disp: 14 tablet, Rfl: 0   etonogestrel  (NEXPLANON ) 68 MG IMPL implant, 1 each by Subdermal route once., Disp: , Rfl:   "

## 2024-04-02 NOTE — Patient Instructions (Addendum)
" °  Pam Clark, thank you for joining Haze LELON Servant, NP for today's virtual visit.  While this provider is not your primary care provider (PCP), if your PCP is located in our provider database this encounter information will be shared with them immediately following your visit.   A [facility] MyChart account gives you access to today's visit and all your visits, tests, and labs performed at [facility]  click here if you don't have a [facility] MyChart account or go to [email or web address]  Consent: (Patient) Pam Clark provided verbal consent for this virtual visit at the beginning of the encounter.  Current Medications:  Current Outpatient Medications:    sulfamethoxazole -trimethoprim  (BACTRIM  DS) 800-160 MG tablet, Take 1 tablet by mouth 2 (two) times daily for 7 days., Disp: 14 tablet, Rfl: 0   etonogestrel  (NEXPLANON ) 68 MG IMPL implant, 1 each by Subdermal route once., Disp: , Rfl:    Medications ordered in this encounter:  Meds ordered this encounter  Medications   sulfamethoxazole -trimethoprim  (BACTRIM  DS) 800-160 MG tablet    Sig: Take 1 tablet by mouth 2 (two) times daily for 7 days.    Dispense:  14 tablet    Refill:  0    Supervising Provider:   BLAISE ALEENE KIDD [[id number]]     *If you need refills on other medications prior to your next appointment, please contact your pharmacy*  Follow-Up: Call back or seek an in-person evaluation if the symptoms worsen or if the condition fails to improve as anticipated.  [facility] [phone]  Other Instructions  Sent antibiotics for presumed post op surgical infection. She was instructed to follow up with surgeon tomorrow for further recommendations   If you have been instructed to have an in-person evaluation today at a local Urgent Care facility, please use the link below. It will take you to a list of all of our available [facility], including address,  phone number and hours of operation. Please do not delay care.  [facility]  If you or a family member do not have a primary care provider, use the link below to schedule a visit and establish care. When you choose a [facility] primary care physician or advanced practice provider, you gain a long-term partner in health. Find a Primary Care Provider  Learn more about [facility]'s in-office and virtual care options:  - Get Care Now  "

## 2024-04-04 ENCOUNTER — Telehealth

## 2024-04-07 ENCOUNTER — Encounter: Payer: Self-pay | Admitting: *Deleted

## 2024-04-13 ENCOUNTER — Ambulatory Visit: Payer: Self-pay | Admitting: Obstetrics & Gynecology

## 2024-04-13 VITALS — BP 111/70 | HR 94 | Ht 66.0 in | Wt 146.0 lb

## 2024-04-13 DIAGNOSIS — Z9889 Other specified postprocedural states: Secondary | ICD-10-CM

## 2024-04-13 NOTE — Progress Notes (Signed)
" °  HPI: Patient returns for routine postoperative follow-up having undergone     ICD-10-CM   1. Post-operative state: S/P excision Batholin's Gland cyst 03/21/2024  Z98.890        The patient's immediate postoperative recovery has been unremarkable. Since hospital discharge the patient reports couple weeks of pain tenderness.   Current Outpatient Medications: etonogestrel  (NEXPLANON ) 68 MG IMPL implant, 1 each by Subdermal route once., Disp: , Rfl:   No current facility-administered medications for this visit.    Blood pressure 111/70, pulse 94, height 5' 6 (1.676 m), weight 146 lb (66.2 kg).  Physical Exam: Surgical site is healing well, firm from healing process but no signs of infection, minimally tender   Diagnostic Tests:   Pathology: Benign Bartholin gland cyst  Impression + Management plan:   ICD-10-CM   1. Post-operative state: S/P excision Batholin's Gland cyst 03/21/2024  Z98.890           Medications Prescribed this encounter: No orders of the defined types were placed in this encounter.     Follow up: No follow-ups on file.    Vonn VEAR Inch, MD Attending Physician for the [facility] and [facility] 04/13/2024 4:06 PM     "

## 2024-04-21 DIAGNOSIS — Z029 Encounter for administrative examinations, unspecified: Secondary | ICD-10-CM

## 2024-04-25 ENCOUNTER — Telehealth: Payer: Self-pay | Admitting: Family Medicine

## 2024-04-25 DIAGNOSIS — R059 Cough, unspecified: Secondary | ICD-10-CM

## 2024-04-25 DIAGNOSIS — R6889 Other general symptoms and signs: Secondary | ICD-10-CM

## 2024-04-25 MED ORDER — OSELTAMIVIR PHOSPHATE 75 MG PO CAPS: 75.0000 mg | ORAL_CAPSULE | Freq: Two times a day (BID) | ORAL | 0 refills | Status: AC

## 2024-04-25 MED ORDER — BENZONATATE 100 MG PO CAPS: 100.0000 mg | ORAL_CAPSULE | Freq: Three times a day (TID) | ORAL | 0 refills | Status: AC | PRN

## 2024-04-25 NOTE — Patient Instructions (Signed)
 " Pam Clark, thank you for joining Roosvelt Mater, PA-C for today's virtual visit.  While this provider is not your primary care provider (PCP), if your PCP is located in our provider database this encounter information will be shared with them immediately following your visit.   A [facility] MyChart account gives you access to today's visit and all your visits, tests, and labs performed at [facility]  click here if you don't have a [facility] MyChart account or go to [email or web address]  Consent: (Patient) Pam Clark provided verbal consent for this virtual visit at the beginning of the encounter.  Current Medications:  Current Outpatient Medications:    benzonatate  (TESSALON ) 100 MG capsule, Take 1 capsule (100 mg total) by mouth 3 (three) times daily as needed for up to 7 days., Disp: 21 capsule, Rfl: 0   oseltamivir  (TAMIFLU ) 75 MG capsule, Take 1 capsule (75 mg total) by mouth 2 (two) times daily for 5 days., Disp: 10 capsule, Rfl: 0   etonogestrel  (NEXPLANON ) 68 MG IMPL implant, 1 each by Subdermal route once., Disp: , Rfl:    Medications ordered in this encounter:  Meds ordered this encounter  Medications   oseltamivir  (TAMIFLU ) 75 MG capsule    Sig: Take 1 capsule (75 mg total) by mouth 2 (two) times daily for 5 days.    Dispense:  10 capsule    Refill:  0   benzonatate  (TESSALON ) 100 MG capsule    Sig: Take 1 capsule (100 mg total) by mouth 3 (three) times daily as needed for up to 7 days.    Dispense:  21 capsule    Refill:  0     *If you need refills on other medications prior to your next appointment, please contact your pharmacy*  Follow-Up: Call back or seek an in-person evaluation if the symptoms worsen or if the condition fails to improve as anticipated.  [facility] [phone]  Other Instructions Influenza, Adult Influenza is also called the flu. It's an infection that affects your respiratory tract. This  includes your nose, throat, windpipe, and lungs. The flu is contagious. This means it spreads easily from person to person. It causes symptoms that are like a cold. It can also cause a high fever and body aches. What are the causes? The flu is caused by the influenza virus. You can get it by: Breathing in droplets that are in the air after an infected person coughs or sneezes. Touching something that has the virus on it and then touching your mouth, nose, or eyes. What increases the risk? You may be more likely to get the flu if: You don't wash your hands often. You're near a lot of people during cold and flu season. You touch your mouth, eyes, or nose without washing your hands first. You don't get a flu shot each year. You may also be more at risk for the flu and serious problems, such as a lung infection called pneumonia, if: You're older than 65. You're pregnant. Your immune system is weak. Your immune system is your body's defense system. You have a long-term, or chronic, condition, such as: Heart, kidney, or lung disease. Diabetes. A liver disorder. Asthma. You're very overweight. You have anemia. This is when you don't have enough red blood cells in your body. What are the signs or symptoms? Flu symptoms often start all of a sudden. They may last 4-14 days and include: Fever and chills. Headaches, body aches, or muscle  aches. Sore throat. Cough. Runny or stuffy nose. Discomfort in your chest. Not wanting to eat as much as normal. Feeling weak or tired. Feeling dizzy. Nausea or vomiting. How is this diagnosed? The flu may be diagnosed based on your symptoms and medical history. You may also have a physical exam. A swab may be taken from your nose or throat and tested for the virus. How is this treated? If the flu is found early, you can be treated with antiviral medicine. This may be given to you by mouth or through an IV. It can help you feel less sick and get better  faster. Taking care of yourself at home can also help your symptoms get better. Your health care provider may tell you to: Take over-the-counter medicines. Drink lots of fluids. The flu often goes away on its own. If you have very bad symptoms or problems caused by the flu, you may need to be treated in a hospital. Follow these instructions at home: Activity Rest as needed. Get lots of sleep. Stay home from work or school as told by your provider. Leave home only to go see your provider. Do not leave home for other reasons until you don't have a fever for 24 hours without taking medicine. Eating and drinking Take an oral rehydration solution (ORS). This is a drink that is sold at pharmacies and stores. Drink enough fluid to keep your pee pale yellow. Try to drink small amounts of clear fluids. These include water, ice chips, fruit juice mixed with water, and low-calorie sports drinks. Try to eat bland foods that are easy to digest. These include bananas, applesauce, rice, lean meats, toast, and crackers. Avoid drinks that have a lot of sugar or caffeine in them. These include energy drinks, regular sports drinks, and soda. Do not drink alcohol. Do not eat spicy or fatty foods. General instructions     Take your medicines only as told by your provider. Use a cool mist humidifier to add moisture to the air in your home. This can make it easier for you to breathe. You should also clean the humidifier every day. To do so: Empty the water. Pour clean water in. Cover your mouth and nose when you cough or sneeze. Wash your hands with soap and water often and for at least 20 seconds. It's extra important to do so after you cough or sneeze. If you can't use soap and water, use hand sanitizer. How is this prevented?  Get a flu shot every year. Ask your provider when you should get your flu shot. Stay away from people who are sick during fall and winter. Fall and winter are cold and flu  season. Contact a health care provider if: You get new symptoms. You have chest pain. You have watery poop, also called diarrhea. You have a fever. Your cough gets worse. You start to have more mucus. You feel like you may vomit, or you vomit. Get help right away if: You become short of breath or have trouble breathing. Your skin or nails turn blue. You have very bad pain or stiffness in your neck. You get a sudden headache or pain in your face or ear. You vomit each time you eat or drink. These symptoms may be an emergency. Call 911 right away. Do not wait to see if the symptoms will go away. Do not drive yourself to the hospital. This information is not intended to replace advice given to you by your health care provider. Make  sure you discuss any questions you have with your health care provider. Document Revised: 12/17/2022 Document Reviewed: 04/23/2022 Elsevier Patient Education  2024 Elsevier Inc.   If you have been instructed to have an in-person evaluation today at a local Urgent Care facility, please use the link below. It will take you to a list of all of our available [facility], including address, phone number and hours of operation. Please do not delay care.  [facility]  If you or a family member do not have a primary care provider, use the link below to schedule a visit and establish care. When you choose a [facility] primary care physician or advanced practice provider, you gain a long-term partner in health. Find a Primary Care Provider  Learn more about [facility]'s in-office and virtual care options: [facility] - Get Care Now  "

## 2024-04-25 NOTE — Progress Notes (Signed)
 " Virtual Visit Consent   Pam Clark, you are scheduled for a virtual visit with a [facility] provider today. Just as with appointments in the office, your consent must be obtained to participate. Your consent will be active for this visit and any virtual visit you may have with one of our providers in the next 365 days. If you have a MyChart account, a copy of this consent can be sent to you electronically.  As this is a virtual visit, video technology does not allow for your provider to perform a traditional examination. This may limit your provider's ability to fully assess your condition. If your provider identifies any concerns that need to be evaluated in person or the need to arrange testing (such as labs, EKG, etc.), we will make arrangements to do so. Although advances in technology are sophisticated, we cannot ensure that it will always work on either your end or our end. If the connection with a video visit is poor, the visit may have to be switched to a telephone visit. With either a video or telephone visit, we are not always able to ensure that we have a secure connection.  By engaging in this virtual visit, you consent to the provision of healthcare and authorize for your insurance to be billed (if applicable) for the services provided during this visit. Depending on your insurance coverage, you may receive a charge related to this service.  I need to obtain your verbal consent now. Are you willing to proceed with your visit today? Pam Clark has provided verbal consent on 04/25/2024 for a virtual visit (video or telephone). Pam Clark, [place]  Date: 04/25/2024 7:38 PM   Virtual Visit via Video Note   I, Pam Clark, connected with  Pam Clark  ([id number], 06-Oct-1995) on 04/25/24 at  7:30 PM EST by a video-enabled telemedicine application and verified that I am speaking with the correct person using two identifiers.  Location: Patient: Virtual Visit  Location Patient: Home Provider: Virtual Visit Location Provider: Home Office   I discussed the limitations of evaluation and management by telemedicine and the availability of in person appointments. The patient expressed understanding and agreed to proceed.    History of Present Illness: Pam Clark is a 29 y.o. who identifies as a female who was assigned female at birth, and is being seen today for c/o flu like symptoms.  Pt states she has chills, a cough and has body aches and shortness of breath.  Pt states symptoms started yesterday.Pt states taking day quil cold and flu and sleeping a drinking a lot of water.  Pt states she went to her friends house Friday and her friends kids were really sick could be flu but unsure. Pt states she feels warmer than normal and is feeling cold.  Pt states she has also been sweating. Pt states she also has a headache.   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  Resting comfortably  at home.  Head is normocephalic, atraumatic.  No labored breathing.  Speech is clear and coherent with logical content.  Patient is alert and oriented at baseline.    Assessment and Plan: 1. Cough, unspecified type  (Primary) - benzonatate  (TESSALON ) 100 MG capsule; Take 1 capsule (100 mg total) by mouth 3 (three) times daily as needed for up to 7 days.  Dispense: 21 capsule; Refill: 0  2. Flu-like symptoms - oseltamivir  (TAMIFLU ) 75 MG capsule; Take 1 capsule (75 mg total) by mouth 2 (two) times daily for 5 days.  Dispense: 10 capsule; Refill: 0  -Pt advised to follow up in person with urgent care or emergency room for persistent or worsening symptoms.   Follow Up Instructions: I discussed the assessment and treatment plan with the patient. The patient was provided an opportunity to ask questions and all were answered. The patient agreed with the plan and demonstrated an understanding of the instructions.  A copy of instructions were sent to the patient via MyChart unless otherwise noted below.    The patient was advised to call back or seek an in-person evaluation if the symptoms worsen or if the condition fails to improve as anticipated.    Pam Mater, PA-C    [1]  Allergies Allergen Reactions   Doxycycline  Nausea And Vomiting  [2]  Current Outpatient Medications:    benzonatate  (TESSALON ) 100 MG capsule, Take 1 capsule (100 mg total) by mouth 3 (three) times daily as needed for up to 7 days., Disp: 21 capsule, Rfl: 0   oseltamivir  (TAMIFLU ) 75 MG capsule, Take 1 capsule (75 mg total) by mouth 2 (two) times daily for 5 days., Disp: 10 capsule, Rfl: 0   etonogestrel  (NEXPLANON ) 68 MG IMPL implant, 1 each by Subdermal route once., Disp: , Rfl:   "
